# Patient Record
Sex: Male | Born: 1980 | Race: White | Hispanic: No | Marital: Married | Smoking: Never smoker
Health system: Southern US, Community
[De-identification: ages and names within clinical notes are randomized; demographics above are authoritative.]

## PROBLEM LIST (undated history)

## (undated) DIAGNOSIS — N289 Disorder of kidney and ureter, unspecified: Secondary | ICD-10-CM

## (undated) DIAGNOSIS — M109 Gout, unspecified: Secondary | ICD-10-CM

## (undated) DIAGNOSIS — E039 Hypothyroidism, unspecified: Secondary | ICD-10-CM

## (undated) HISTORY — PX: TONSILLECTOMY: SUR1361

## (undated) HISTORY — PX: HERNIA REPAIR: SHX51

---

## 2013-06-17 DIAGNOSIS — J309 Allergic rhinitis, unspecified: Secondary | ICD-10-CM | POA: Insufficient documentation

## 2013-06-30 DIAGNOSIS — K4090 Unilateral inguinal hernia, without obstruction or gangrene, not specified as recurrent: Secondary | ICD-10-CM | POA: Insufficient documentation

## 2014-01-21 DIAGNOSIS — R1032 Left lower quadrant pain: Secondary | ICD-10-CM | POA: Insufficient documentation

## 2014-05-31 DIAGNOSIS — G8194 Hemiplegia, unspecified affecting left nondominant side: Secondary | ICD-10-CM | POA: Insufficient documentation

## 2014-05-31 DIAGNOSIS — G930 Cerebral cysts: Secondary | ICD-10-CM | POA: Insufficient documentation

## 2014-06-01 DIAGNOSIS — F449 Dissociative and conversion disorder, unspecified: Secondary | ICD-10-CM | POA: Insufficient documentation

## 2014-09-12 DIAGNOSIS — E538 Deficiency of other specified B group vitamins: Secondary | ICD-10-CM | POA: Insufficient documentation

## 2015-12-04 DIAGNOSIS — E782 Mixed hyperlipidemia: Secondary | ICD-10-CM | POA: Insufficient documentation

## 2016-05-29 DIAGNOSIS — R7303 Prediabetes: Secondary | ICD-10-CM | POA: Insufficient documentation

## 2016-05-30 DIAGNOSIS — B001 Herpesviral vesicular dermatitis: Secondary | ICD-10-CM | POA: Insufficient documentation

## 2016-12-17 DIAGNOSIS — H9193 Unspecified hearing loss, bilateral: Secondary | ICD-10-CM | POA: Insufficient documentation

## 2018-02-27 DIAGNOSIS — B009 Herpesviral infection, unspecified: Secondary | ICD-10-CM | POA: Insufficient documentation

## 2018-05-03 ENCOUNTER — Other Ambulatory Visit: Payer: Self-pay

## 2018-05-03 ENCOUNTER — Emergency Department
Admission: EM | Admit: 2018-05-03 | Discharge: 2018-05-03 | Disposition: A | Discharge status: Home / Self Care | Payer: 59 | Attending: Emergency Medicine | Admitting: Emergency Medicine

## 2018-05-03 DIAGNOSIS — M109 Gout, unspecified: Secondary | ICD-10-CM

## 2018-05-03 DIAGNOSIS — E039 Hypothyroidism, unspecified: Secondary | ICD-10-CM | POA: Diagnosis not present

## 2018-05-03 DIAGNOSIS — M79671 Pain in right foot: Secondary | ICD-10-CM | POA: Diagnosis present

## 2018-05-03 LAB — CBC WITH DIFFERENTIAL/PLATELET
Basophils Absolute: 0 10*3/uL (ref 0–0.1)
Basophils Relative: 0 %
Eosinophils Absolute: 0 10*3/uL (ref 0–0.7)
Eosinophils Relative: 1 %
HCT: 42.8 % (ref 40.0–52.0)
HEMOGLOBIN: 14.5 g/dL (ref 13.0–18.0)
LYMPHS ABS: 1.7 10*3/uL (ref 1.0–3.6)
Lymphocytes Relative: 21 %
MCH: 30.5 pg (ref 26.0–34.0)
MCHC: 33.9 g/dL (ref 32.0–36.0)
MCV: 90 fL (ref 80.0–100.0)
Monocytes Absolute: 0.6 10*3/uL (ref 0.2–1.0)
Monocytes Relative: 8 %
NEUTROS ABS: 5.6 10*3/uL (ref 1.4–6.5)
NEUTROS PCT: 70 %
PLATELETS: 158 10*3/uL (ref 150–440)
RBC: 4.76 MIL/uL (ref 4.40–5.90)
RDW: 13.6 % (ref 11.5–14.5)
WBC: 8 10*3/uL (ref 3.8–10.6)

## 2018-05-03 LAB — BASIC METABOLIC PANEL
Anion gap: 8 (ref 5–15)
BUN: 14 mg/dL (ref 6–20)
CHLORIDE: 103 mmol/L (ref 101–111)
CO2: 27 mmol/L (ref 22–32)
Calcium: 9.1 mg/dL (ref 8.9–10.3)
Creatinine, Ser: 1.33 mg/dL — ABNORMAL HIGH (ref 0.61–1.24)
GFR calc Af Amer: >60 mL/min (ref >60)
GFR calc non Af Amer: >60 mL/min (ref >60)
Glucose, Bld: 94 mg/dL (ref 65–99)
Potassium: 3.7 mmol/L (ref 3.5–5.1)
SODIUM: 138 mmol/L (ref 135–145)

## 2018-05-03 LAB — URIC ACID: Uric Acid, Serum: 9.4 mg/dL — ABNORMAL HIGH (ref 4.4–7.6)

## 2018-05-03 MED ORDER — COLCHICINE 0.6 MG PO TABS: 1.2000 mg | ORAL_TABLET | Freq: Once | ORAL | 0 refills | Status: AC

## 2018-05-03 MED ORDER — COLCHICINE 0.6 MG PO TABS
1.2000 mg | ORAL_TABLET | Freq: Once | ORAL | Status: AC
  Administered 2018-05-03: 1.2 mg via ORAL
  Filled 2018-05-03: qty 2

## 2018-05-03 MED ORDER — HYDROCODONE-ACETAMINOPHEN 5-325 MG PO TABS: 1.0000 | ORAL_TABLET | ORAL | 0 refills | Status: AC | PRN

## 2018-05-03 MED ORDER — COLCHICINE 0.6 MG PO TABS
0.6000 mg | ORAL_TABLET | Freq: Once | ORAL | Status: AC
  Administered 2018-05-03: 0.6 mg via ORAL
  Filled 2018-05-03: qty 1

## 2018-05-03 NOTE — ED Provider Notes (Signed)
[facility] Emergency Department Provider Note ____________________________________________  Time seen: Approximately 3:21 PM  I have reviewed the triage vital signs and the nursing notes.   HISTORY  Chief Complaint Foot Pain    HPI Pedro Elliott is a 37 y.o. male who presents to the emergency department for evaluation and treatment of right ankle pain. He had similar symptoms in January while on a cruise and was evaluated by his PCP on his return, but symptoms had already resolved. Current pain started about 24 hours ago and has gradually progressed. Initially it was partially relieved by ibuprofen, but now is not. His last dose was at about 10 am. His only significant past medical history would be hypothyroidism.  He takes daily thyroid medication, but otherwise does not take any daily meds.  No past medical history on file.  There are no active problems to display for this patient.   Prior to Admission medications   Medication Sig Start Date End Date Taking? Authorizing Provider  colchicine 0.6 MG tablet Take 2 tablets (1.2 mg total) by mouth once for 1 dose. If pain persists after 1 hour, take 1 additional pill. No more than 3 pills in 24 hours. 05/03/18 05/03/18  Calloway Andrus, Rulon Eisenmenger B, FNP  HYDROcodone-acetaminophen (NORCO/VICODIN) 5-325 MG tablet Take 1 tablet by mouth every 4 (four) hours as needed for moderate pain. 05/03/18 05/03/19  Chinita Pester, FNP    Allergies Patient has no known allergies.  No family history on file.  Social History Social History   Tobacco Use  . Smoking status: Not on file  Substance Use Topics  . Alcohol use: Not on file  . Drug use: Not on file    Review of Systems Constitutional: Negative for fever. Cardiovascular: Negative for chest pain. Respiratory: Negative for shortness of breath. Musculoskeletal: Positive for right ankle pain. Skin: Positive for erythema and mild edema of the right ankle Neurological: Negative  for decrease in sensation  ____________________________________________   PHYSICAL EXAM:  VITAL SIGNS: ED Triage Vitals  Enc Vitals Group     BP 05/03/18 1324 (!) 125/95     Pulse Rate 05/03/18 1324 94     Resp 05/03/18 1324 18     Temp 05/03/18 1324 98.4 F (36.9 C)     Temp Source 05/03/18 1324 Oral     SpO2 05/03/18 1324 100 %     Weight 05/03/18 1325 235 lb (106.6 kg)     Height 05/03/18 1325  (1.88 m)     Head Circumference --      Peak Flow --      Pain Score 05/03/18 1324 9     Pain Loc --      Pain Edu? --      Excl. in [place]? --     Constitutional: Alert and oriented. Well appearing and in no acute distress. Eyes: Conjunctivae are clear without discharge or drainage Head: Atraumatic Neck: Supple Respiratory: No cough. Respirations are even and unlabored. Musculoskeletal: Full range of motion of the right foot and ankle is possible but painful. Neurologic: Sensation and motor function is intact throughout. Skin: Mild edema and erythema diffuse over the right ankle with increase in skin temperature. Psychiatric: Affect and behavior are appropriate.  ____________________________________________   LABS (all labs ordered are listed, but only abnormal results are displayed)  Labs Reviewed  URIC ACID - Abnormal; Notable for the following components:      Result Value   Uric Acid, Serum 9.4 (*)  All other components within normal limits  BASIC METABOLIC PANEL - Abnormal; Notable for the following components:   Creatinine, Ser 1.33 (*)    All other components within normal limits  CBC WITH DIFFERENTIAL/PLATELET   ____________________________________________  RADIOLOGY  Not indicated ____________________________________________   PROCEDURES  Procedures  ____________________________________________   INITIAL IMPRESSION / ASSESSMENT AND PLAN / ED COURSE  Pedro Elliott is a 37 y.o. who presents to the emergency department for treatment and evaluation  of sudden onset right ankle pain.  Patient had similar symptoms while on a cruise a few months ago and was suspected to have gout.  Uric acid, CBC, and BMP will be requested today.  He will be given 1.2 mg of colchicine and then reevaluated in about an hour.  ----------------------------------------- 4:36 PM on 05/03/2018 -----------------------------------------  Pain has started to improve after colchicine. Additional tablet has been ordered.  Awaiting lab studies.  Labs were discussed with the patient who will be discharged with colchicine and norco. He is to return to the ER for symptoms that change or worsen or for new concerns if unable to schedule an appointment.  Patient instructed to follow-up with his PCP.  He was also instructed to return to the emergency department for symptoms that change or worsen if unable schedule an appointment with orthopedics or primary care.  Medications  colchicine tablet 1.2 mg (1.2 mg Oral Given 05/03/18 1558)  colchicine tablet 0.6 mg (0.6 mg Oral Given 05/03/18 1703)    Pertinent labs & imaging results that were available during my care of the patient were reviewed by me and considered in my medical decision making (see chart for details).  _________________________________________   FINAL CLINICAL IMPRESSION(S) / ED DIAGNOSES  Final diagnoses:  Acute gout of right ankle, unspecified cause    ED Discharge Orders        Ordered    colchicine 0.6 MG tablet   Once     05/03/18 1708    HYDROcodone-acetaminophen (NORCO/VICODIN) 5-325 MG tablet  Every 4 hours PRN     05/03/18 1708       If controlled substance prescribed during this visit, 12 month history viewed on the [facility] prior to issuing an initial prescription for Schedule II or III opiod.    Chinita Pester, FNP 05/03/18 1955    Emily Filbert, MD 05/03/18 2059

## 2018-05-03 NOTE — ED Triage Notes (Signed)
Pt reports that while on a cruise in January he started having rt foot pain and was told on the cruise it may be gout. Pt states that it started bothering him again yesterday and the swelling in his foot is worse and radiating into his ankle, pt is limping with his gait, states that he took ibuprofen last night with relief, no relief this am when he took it. Pedal pulse palpated

## 2018-05-18 DIAGNOSIS — J0100 Acute maxillary sinusitis, unspecified: Secondary | ICD-10-CM | POA: Insufficient documentation

## 2018-10-14 ENCOUNTER — Other Ambulatory Visit: Payer: Self-pay | Admitting: Gastroenterology

## 2018-10-14 DIAGNOSIS — K7689 Other specified diseases of liver: Secondary | ICD-10-CM

## 2018-10-21 ENCOUNTER — Ambulatory Visit: Payer: 59

## 2018-10-29 ENCOUNTER — Ambulatory Visit
Admission: RE | Admit: 2018-10-29 | Discharge: 2018-10-29 | Disposition: A | Discharge status: Home / Self Care | Payer: 59 | Source: Ambulatory Visit | Attending: Gastroenterology | Admitting: Gastroenterology

## 2018-10-29 DIAGNOSIS — K7689 Other specified diseases of liver: Secondary | ICD-10-CM | POA: Diagnosis present

## 2018-10-29 DIAGNOSIS — N281 Cyst of kidney, acquired: Secondary | ICD-10-CM | POA: Diagnosis not present

## 2018-11-04 ENCOUNTER — Other Ambulatory Visit: Payer: Self-pay | Admitting: Gastroenterology

## 2018-11-04 DIAGNOSIS — K824 Cholesterolosis of gallbladder: Secondary | ICD-10-CM

## 2018-11-12 DIAGNOSIS — R200 Anesthesia of skin: Secondary | ICD-10-CM | POA: Insufficient documentation

## 2019-02-03 ENCOUNTER — Ambulatory Visit
Admission: RE | Admit: 2019-02-03 | Discharge: 2019-02-03 | Disposition: A | Discharge status: Home / Self Care | Payer: BLUE CROSS/BLUE SHIELD | Source: Ambulatory Visit | Attending: Gastroenterology | Admitting: Gastroenterology

## 2019-02-03 ENCOUNTER — Encounter (INDEPENDENT_AMBULATORY_CARE_PROVIDER_SITE_OTHER): Payer: Self-pay

## 2019-02-03 DIAGNOSIS — K824 Cholesterolosis of gallbladder: Secondary | ICD-10-CM | POA: Insufficient documentation

## 2019-02-18 ENCOUNTER — Other Ambulatory Visit
Admission: RE | Admit: 2019-02-18 | Discharge: 2019-02-18 | Disposition: A | Discharge status: Home / Self Care | Payer: BLUE CROSS/BLUE SHIELD | Attending: Gastroenterology | Admitting: Gastroenterology

## 2019-02-18 DIAGNOSIS — R197 Diarrhea, unspecified: Secondary | ICD-10-CM | POA: Insufficient documentation

## 2019-02-18 DIAGNOSIS — R194 Change in bowel habit: Secondary | ICD-10-CM | POA: Diagnosis not present

## 2019-02-18 DIAGNOSIS — R1010 Upper abdominal pain, unspecified: Secondary | ICD-10-CM | POA: Diagnosis not present

## 2019-02-18 LAB — C DIFFICILE QUICK SCREEN W PCR REFLEX
C DIFFICILE (CDIFF) INTERP: NOT DETECTED
C DIFFICILE (CDIFF) TOXIN: NEGATIVE
C DIFFICLE (CDIFF) ANTIGEN: NEGATIVE

## 2019-02-19 LAB — GASTROINTESTINAL PANEL BY PCR, STOOL (REPLACES STOOL CULTURE)

## 2019-02-19 LAB — CALPROTECTIN, FECAL: Calprotectin, Fecal: <16 ug/g (ref 0–120)

## 2019-04-13 ENCOUNTER — Other Ambulatory Visit: Payer: Self-pay | Admitting: Gastroenterology

## 2019-04-13 DIAGNOSIS — R112 Nausea with vomiting, unspecified: Secondary | ICD-10-CM

## 2019-06-23 ENCOUNTER — Encounter
Admission: RE | Admit: 2019-06-23 | Discharge: 2019-06-23 | Disposition: A | Discharge status: Home / Self Care | Payer: BC Managed Care – PPO | Source: Ambulatory Visit | Attending: Gastroenterology | Admitting: Gastroenterology

## 2019-06-23 ENCOUNTER — Other Ambulatory Visit: Payer: Self-pay

## 2019-06-23 DIAGNOSIS — R112 Nausea with vomiting, unspecified: Secondary | ICD-10-CM | POA: Diagnosis not present

## 2019-06-23 MED ORDER — TECHNETIUM TC 99M MEBROFENIN IV KIT
4.9460 | PACK | Freq: Once | INTRAVENOUS | Status: AC | PRN
  Administered 2019-06-23: 4.946 via INTRAVENOUS

## 2019-09-15 DIAGNOSIS — Z79899 Other long term (current) drug therapy: Secondary | ICD-10-CM | POA: Insufficient documentation

## 2019-09-15 DIAGNOSIS — M1A00X0 Idiopathic chronic gout, unspecified site, without tophus (tophi): Secondary | ICD-10-CM | POA: Insufficient documentation

## 2020-01-25 DIAGNOSIS — G4733 Obstructive sleep apnea (adult) (pediatric): Secondary | ICD-10-CM | POA: Insufficient documentation

## 2020-05-17 ENCOUNTER — Other Ambulatory Visit: Payer: Self-pay

## 2020-05-17 ENCOUNTER — Encounter: Payer: Self-pay | Admitting: Emergency Medicine

## 2020-05-17 ENCOUNTER — Emergency Department
Admission: EM | Admit: 2020-05-17 | Discharge: 2020-05-17 | Disposition: A | Discharge status: Home / Self Care | Payer: Medicaid Other | Attending: Emergency Medicine | Admitting: Emergency Medicine

## 2020-05-17 ENCOUNTER — Emergency Department: Payer: Medicaid Other

## 2020-05-17 DIAGNOSIS — N201 Calculus of ureter: Secondary | ICD-10-CM | POA: Diagnosis not present

## 2020-05-17 DIAGNOSIS — R1032 Left lower quadrant pain: Secondary | ICD-10-CM | POA: Diagnosis present

## 2020-05-17 DIAGNOSIS — E039 Hypothyroidism, unspecified: Secondary | ICD-10-CM | POA: Diagnosis not present

## 2020-05-17 DIAGNOSIS — R110 Nausea: Secondary | ICD-10-CM | POA: Diagnosis not present

## 2020-05-17 HISTORY — DX: Gout, unspecified: M10.9

## 2020-05-17 HISTORY — DX: Hypothyroidism, unspecified: E03.9

## 2020-05-17 LAB — CBC WITH DIFFERENTIAL/PLATELET
Abs Immature Granulocytes: 0.02 10*3/uL (ref 0.00–0.07)
Basophils Absolute: 0.1 10*3/uL (ref 0.0–0.1)
Basophils Relative: 1 %
Eosinophils Absolute: 0.1 10*3/uL (ref 0.0–0.5)
Eosinophils Relative: 1 %
HCT: 41.3 % (ref 39.0–52.0)
Hemoglobin: 14.2 g/dL (ref 13.0–17.0)
Immature Granulocytes: 0 %
Lymphocytes Relative: 44 %
Lymphs Abs: 3 10*3/uL (ref 0.7–4.0)
MCH: 30.7 pg (ref 26.0–34.0)
MCHC: 34.4 g/dL (ref 30.0–36.0)
MCV: 89.2 fL (ref 80.0–100.0)
Monocytes Absolute: 0.6 10*3/uL (ref 0.1–1.0)
Monocytes Relative: 8 %
Neutro Abs: 3.2 10*3/uL (ref 1.7–7.7)
Neutrophils Relative %: 46 %
Platelets: 199 10*3/uL (ref 150–400)
RBC: 4.63 MIL/uL (ref 4.22–5.81)
RDW: 12.7 % (ref 11.5–15.5)
WBC: 6.9 10*3/uL (ref 4.0–10.5)
nRBC: 0 % (ref 0.0–0.2)

## 2020-05-17 LAB — COMPREHENSIVE METABOLIC PANEL
ALT: 32 U/L (ref 0–44)
AST: 25 U/L (ref 15–41)
Albumin: 4.6 g/dL (ref 3.5–5.0)
Alkaline Phosphatase: 47 U/L (ref 38–126)
Anion gap: 8 (ref 5–15)
BUN: 13 mg/dL (ref 6–20)
CO2: 26 mmol/L (ref 22–32)
Calcium: 9.5 mg/dL (ref 8.9–10.3)
Chloride: 106 mmol/L (ref 98–111)
Creatinine, Ser: 1.35 mg/dL — ABNORMAL HIGH (ref 0.61–1.24)
GFR calc Af Amer: >60 mL/min (ref >60)
GFR calc non Af Amer: >60 mL/min (ref >60)
Glucose, Bld: 120 mg/dL — ABNORMAL HIGH (ref 70–99)
Potassium: 3.6 mmol/L (ref 3.5–5.1)
Sodium: 140 mmol/L (ref 135–145)
Total Bilirubin: 0.8 mg/dL (ref 0.3–1.2)
Total Protein: 7.5 g/dL (ref 6.5–8.1)

## 2020-05-17 LAB — URINALYSIS, COMPLETE (UACMP) WITH MICROSCOPIC
Bacteria, UA: NONE SEEN
Bilirubin Urine: NEGATIVE
Glucose, UA: NEGATIVE mg/dL
Ketones, ur: NEGATIVE mg/dL
Leukocytes,Ua: NEGATIVE
Nitrite: NEGATIVE
Protein, ur: NEGATIVE mg/dL
RBC / HPF: >50 RBC/hpf — ABNORMAL HIGH (ref 0–5)
Specific Gravity, Urine: 1.019 (ref 1.005–1.030)
Squamous Epithelial / LPF: NONE SEEN (ref 0–5)
pH: 7 (ref 5.0–8.0)

## 2020-05-17 MED ORDER — LACTATED RINGERS IV BOLUS: 1000.0000 mL | Freq: Once | INTRAVENOUS | Status: DC

## 2020-05-17 MED ORDER — OXYCODONE-ACETAMINOPHEN 5-325 MG PO TABS
1.0000 | ORAL_TABLET | Freq: Once | ORAL | Status: AC
  Administered 2020-05-17: 1 via ORAL
  Filled 2020-05-17: qty 1

## 2020-05-17 MED ORDER — MORPHINE SULFATE (PF) 4 MG/ML IV SOLN
4.0000 mg | Freq: Once | INTRAVENOUS | Status: AC
  Administered 2020-05-17: 4 mg via INTRAVENOUS
  Filled 2020-05-17: qty 1

## 2020-05-17 MED ORDER — KETOROLAC TROMETHAMINE 30 MG/ML IJ SOLN
15.0000 mg | Freq: Once | INTRAMUSCULAR | Status: AC
  Administered 2020-05-17: 15 mg via INTRAVENOUS
  Filled 2020-05-17: qty 1

## 2020-05-17 MED ORDER — ONDANSETRON 4 MG PO TBDP: 4.0000 mg | ORAL_TABLET | Freq: Three times a day (TID) | ORAL | 0 refills | Status: DC | PRN

## 2020-05-17 MED ORDER — TAMSULOSIN HCL 0.4 MG PO CAPS: 0.4000 mg | ORAL_CAPSULE | Freq: Every day | ORAL | 0 refills | Status: DC

## 2020-05-17 MED ORDER — KETOROLAC TROMETHAMINE 30 MG/ML IJ SOLN
INTRAMUSCULAR | Status: AC
  Filled 2020-05-17: qty 1

## 2020-05-17 MED ORDER — OXYCODONE-ACETAMINOPHEN 5-325 MG PO TABS: 1.0000 | ORAL_TABLET | ORAL | 0 refills | Status: DC | PRN

## 2020-05-17 MED ORDER — ONDANSETRON HCL 4 MG/2ML IJ SOLN
4.0000 mg | Freq: Once | INTRAMUSCULAR | Status: AC
  Administered 2020-05-17: 4 mg via INTRAVENOUS
  Filled 2020-05-17: qty 2

## 2020-05-17 NOTE — ED Provider Notes (Signed)
[facility] Emergency Department Provider Note   ____________________________________________   First MD Initiated Contact with Patient 05/17/20 2014     (approximate)  I have reviewed the triage vital signs and the nursing notes.   HISTORY  Chief Complaint Flank Pain    HPI Pedro Elliott is a 39 y.o. male with past medical history of hypothyroidism and gout who presents to the ED complaining of flank pain.  Patient reports he first developed pain in the left side of his groin earlier this afternoon, which he thought could be related to an old inguinal hernia.  Since then, pain has spread up into the left lower quadrant of his abdomen and his left flank.  He describes it as sharp and severe, not exacerbated or alleviated by anything.  He has been feeling nauseous but has not vomited.  He denies any fevers, chills, dysuria, or hematuria.  He denies any history of kidney stones.        Past Medical History:  Diagnosis Date  . Gout   . Hypothyroidism     There are no problems to display for this patient.   Past Surgical History:  Procedure Laterality Date  . HERNIA REPAIR    . TONSILLECTOMY      Prior to Admission medications   Medication Sig Start Date End Date Taking? Authorizing Provider  colchicine 0.6 MG tablet Take 2 tablets (1.2 mg total) by mouth once for 1 dose. If pain persists after 1 hour, take 1 additional pill. No more than 3 pills in 24 hours. 05/03/18 05/03/18  Triplett, Johnette Abraham B, FNP  ondansetron (ZOFRAN ODT) 4 MG disintegrating tablet Take 1 tablet (4 mg total) by mouth every 8 (eight) hours as needed for nausea or vomiting. 05/17/20   Blake Divine, MD  oxyCODONE-acetaminophen (PERCOCET) 5-325 MG tablet Take 1 tablet by mouth every 4 (four) hours as needed for severe pain. 05/17/20 05/17/21  Blake Divine, MD  tamsulosin (FLOMAX) 0.4 MG CAPS capsule Take 1 capsule (0.4 mg total) by mouth daily after breakfast. 05/17/20   Blake Divine,  MD    Allergies Patient has no known allergies.  No family history on file.  Social History Social History   Tobacco Use  . Smoking status: Never Smoker  . Smokeless tobacco: Never Used  Substance Use Topics  . Alcohol use: Not on file  . Drug use: Not on file    Review of Systems  Constitutional: No fever/chills Eyes: No visual changes. ENT: No sore throat. Cardiovascular: Denies chest pain. Respiratory: Denies shortness of breath. Gastrointestinal: Positive for flank and abdominal pain.  No nausea, no vomiting.  No diarrhea.  No constipation. Genitourinary: Negative for dysuria. Musculoskeletal: Negative for back pain. Skin: Negative for rash. Neurological: Negative for headaches, focal weakness or numbness.  ____________________________________________   PHYSICAL EXAM:  VITAL SIGNS: ED Triage Vitals [05/17/20 1923]  Enc Vitals Group     BP (!) 150/82     Pulse Rate 76     Resp 18     Temp 97.6 F (36.4 C)     Temp Source Oral     SpO2 100 %     Weight 235 lb (106.6 kg)     Height 6\' 1"  (1.854 m)     Head Circumference      Peak Flow      Pain Score 10     Pain Loc      Pain Edu?      Excl. in [place]?  Constitutional: Alert and oriented. Eyes: Conjunctivae are normal. Head: Atraumatic. Nose: No congestion/rhinnorhea. Mouth/Throat: Mucous membranes are moist. Neck: Normal ROM Cardiovascular: Normal rate, regular rhythm. Grossly normal heart sounds. Respiratory: Normal respiratory effort.  No retractions. Lungs CTAB. Gastrointestinal: Soft and nontender.  No CVA tenderness bilaterally.  No distention. Genitourinary: deferred Musculoskeletal: No lower extremity tenderness nor edema. Neurologic:  Normal speech and language. No gross focal neurologic deficits are appreciated. Skin:  Skin is warm, dry and intact. No rash noted. Psychiatric: Mood and affect are normal. Speech and behavior are normal.  ____________________________________________    LABS (all labs ordered are listed, but only abnormal results are displayed)  Labs Reviewed  COMPREHENSIVE METABOLIC PANEL - Abnormal; Notable for the following components:      Result Value   Glucose, Bld 120 (*)    Creatinine, Ser 1.35 (*)    All other components within normal limits  URINALYSIS, COMPLETE (UACMP) WITH MICROSCOPIC - Abnormal; Notable for the following components:   Color, Urine YELLOW (*)    APPearance CLOUDY (*)    Hgb urine dipstick LARGE (*)    RBC / HPF >50 (*)    All other components within normal limits  CBC WITH DIFFERENTIAL/PLATELET    PROCEDURES  Procedure(s) performed (including Critical Care):  Procedures   ____________________________________________   INITIAL IMPRESSION / ASSESSMENT AND PLAN / ED COURSE       39 year old male presents to the ED complaining of acute onset left groin pain radiating up into his left flank.  He appears very uncomfortable and in severe pain on evaluation.  CT scan is consistent with nephrolithiasis with 5 mm distal left ureteral stone.  Lab work is unremarkable and UA shows no evidence of infection.  Patient's pain is now much improved following dose of Toradol and he is appropriate for discharge home.  We will provide referral to urology and prescribe Percocet, Flomax, and Zofran.  Patient was counseled to return to the ED for new or worsening symptoms, patient agrees with plan.      ____________________________________________   FINAL CLINICAL IMPRESSION(S) / ED DIAGNOSES  Final diagnoses:  Ureterolithiasis     ED Discharge Orders         Ordered    tamsulosin (FLOMAX) 0.4 MG CAPS capsule  Daily after breakfast     05/17/20 2154    oxyCODONE-acetaminophen (PERCOCET) 5-325 MG tablet  Every 4 hours PRN     05/17/20 2154    ondansetron (ZOFRAN ODT) 4 MG disintegrating tablet  Every 8 hours PRN     05/17/20 2154           Note:  This document was prepared using Dragon voice recognition software and  may include unintentional dictation errors.   Chesley Noon, MD 05/17/20 2228

## 2020-05-17 NOTE — ED Triage Notes (Signed)
Pt to triage via w/c, mask in place, appears uncomfortable; c/o left flank/groin that began PTA with no accomp symptoms; denies hx of same

## 2020-05-19 ENCOUNTER — Observation Stay: Payer: Medicaid Other

## 2020-05-19 ENCOUNTER — Other Ambulatory Visit: Payer: Self-pay

## 2020-05-19 ENCOUNTER — Observation Stay
Admission: EM | Admit: 2020-05-19 | Discharge: 2020-05-20 | Disposition: A | Discharge status: Home / Self Care | Payer: Medicaid Other | Attending: Internal Medicine | Admitting: Internal Medicine

## 2020-05-19 DIAGNOSIS — N132 Hydronephrosis with renal and ureteral calculous obstruction: Principal | ICD-10-CM | POA: Insufficient documentation

## 2020-05-19 DIAGNOSIS — Z79899 Other long term (current) drug therapy: Secondary | ICD-10-CM | POA: Insufficient documentation

## 2020-05-19 DIAGNOSIS — Z79890 Hormone replacement therapy (postmenopausal): Secondary | ICD-10-CM | POA: Diagnosis not present

## 2020-05-19 DIAGNOSIS — N182 Chronic kidney disease, stage 2 (mild): Secondary | ICD-10-CM | POA: Diagnosis not present

## 2020-05-19 DIAGNOSIS — M109 Gout, unspecified: Secondary | ICD-10-CM | POA: Diagnosis not present

## 2020-05-19 DIAGNOSIS — N1330 Unspecified hydronephrosis: Secondary | ICD-10-CM | POA: Diagnosis not present

## 2020-05-19 DIAGNOSIS — E876 Hypokalemia: Secondary | ICD-10-CM | POA: Diagnosis present

## 2020-05-19 DIAGNOSIS — E039 Hypothyroidism, unspecified: Secondary | ICD-10-CM | POA: Diagnosis present

## 2020-05-19 DIAGNOSIS — N179 Acute kidney failure, unspecified: Secondary | ICD-10-CM | POA: Insufficient documentation

## 2020-05-19 DIAGNOSIS — Z20822 Contact with and (suspected) exposure to covid-19: Secondary | ICD-10-CM | POA: Diagnosis not present

## 2020-05-19 DIAGNOSIS — N200 Calculus of kidney: Secondary | ICD-10-CM

## 2020-05-19 DIAGNOSIS — N201 Calculus of ureter: Secondary | ICD-10-CM | POA: Diagnosis not present

## 2020-05-19 LAB — BASIC METABOLIC PANEL
Anion gap: 9 (ref 5–15)
BUN: 10 mg/dL (ref 6–20)
CO2: 23 mmol/L (ref 22–32)
Calcium: 9.2 mg/dL (ref 8.9–10.3)
Chloride: 106 mmol/L (ref 98–111)
Creatinine, Ser: 1.39 mg/dL — ABNORMAL HIGH (ref 0.61–1.24)
GFR calc Af Amer: >60 mL/min (ref >60)
GFR calc non Af Amer: >60 mL/min (ref >60)
Glucose, Bld: 119 mg/dL — ABNORMAL HIGH (ref 70–99)
Potassium: 3.4 mmol/L — ABNORMAL LOW (ref 3.5–5.1)
Sodium: 138 mmol/L (ref 135–145)

## 2020-05-19 LAB — URINALYSIS, COMPLETE (UACMP) WITH MICROSCOPIC
Bacteria, UA: NONE SEEN
Bilirubin Urine: NEGATIVE
Glucose, UA: NEGATIVE mg/dL
Ketones, ur: NEGATIVE mg/dL
Leukocytes,Ua: NEGATIVE
Nitrite: NEGATIVE
Protein, ur: NEGATIVE mg/dL
Specific Gravity, Urine: 1.006 (ref 1.005–1.030)
Squamous Epithelial / HPF: NONE SEEN (ref 0–5)
pH: 5 (ref 5.0–8.0)

## 2020-05-19 LAB — CBC
HCT: 42.7 % (ref 39.0–52.0)
Hemoglobin: 14.3 g/dL (ref 13.0–17.0)
MCH: 30.4 pg (ref 26.0–34.0)
MCHC: 33.5 g/dL (ref 30.0–36.0)
MCV: 90.9 fL (ref 80.0–100.0)
Platelets: 188 10*3/uL (ref 150–400)
RBC: 4.7 MIL/uL (ref 4.22–5.81)
RDW: 12.4 % (ref 11.5–15.5)
WBC: 11.9 10*3/uL — ABNORMAL HIGH (ref 4.0–10.5)
nRBC: 0 % (ref 0.0–0.2)

## 2020-05-19 LAB — PROTIME-INR
INR: 1 (ref 0.8–1.2)
Prothrombin Time: 12.6 seconds (ref 11.4–15.2)

## 2020-05-19 LAB — APTT: aPTT: 25 seconds (ref 24–36)

## 2020-05-19 LAB — SARS CORONAVIRUS 2 BY RT PCR (HOSPITAL ORDER, PERFORMED IN ~~LOC~~ HOSPITAL LAB): SARS Coronavirus 2: NEGATIVE

## 2020-05-19 MED ORDER — HYDROMORPHONE HCL 1 MG/ML IJ SOLN
1.0000 mg | INTRAMUSCULAR | Status: DC | PRN
  Administered 2020-05-19 – 2020-05-20 (×3): 1 mg via INTRAVENOUS
  Filled 2020-05-19 (×3): qty 1

## 2020-05-19 MED ORDER — HYDROMORPHONE HCL 1 MG/ML IJ SOLN
1.0000 mg | INTRAMUSCULAR | Status: DC | PRN
  Administered 2020-05-19: 1 mg via INTRAVENOUS
  Filled 2020-05-19: qty 1

## 2020-05-19 MED ORDER — POTASSIUM CHLORIDE CRYS ER 20 MEQ PO TBCR
20.0000 meq | EXTENDED_RELEASE_TABLET | Freq: Once | ORAL | Status: AC
  Administered 2020-05-19: 20 meq via ORAL
  Filled 2020-05-19: qty 1

## 2020-05-19 MED ORDER — ONDANSETRON HCL 4 MG/2ML IJ SOLN: 4.0000 mg | Freq: Once | INTRAMUSCULAR | Status: AC

## 2020-05-19 MED ORDER — ONDANSETRON HCL 4 MG/2ML IJ SOLN: 4.0000 mg | Freq: Once | INTRAMUSCULAR | Status: DC

## 2020-05-19 MED ORDER — ACETAMINOPHEN 325 MG PO TABS: 650.0000 mg | ORAL_TABLET | Freq: Four times a day (QID) | ORAL | Status: DC | PRN

## 2020-05-19 MED ORDER — ONDANSETRON HCL 4 MG/2ML IJ SOLN
INTRAMUSCULAR | Status: AC
  Filled 2020-05-19: qty 2

## 2020-05-19 MED ORDER — HYDROMORPHONE HCL 1 MG/ML IJ SOLN
INTRAMUSCULAR | Status: AC
  Filled 2020-05-19: qty 1

## 2020-05-19 MED ORDER — HYDROMORPHONE HCL 1 MG/ML IJ SOLN
INTRAMUSCULAR | Status: AC
  Administered 2020-05-19: 1 mg via INTRAVENOUS
  Filled 2020-05-19: qty 1

## 2020-05-19 MED ORDER — OXYCODONE HCL 5 MG PO TABS
5.0000 mg | ORAL_TABLET | ORAL | Status: DC | PRN
  Administered 2020-05-20: 06:00:00 5 mg via ORAL
  Filled 2020-05-19: qty 1

## 2020-05-19 MED ORDER — ONDANSETRON HCL 4 MG/2ML IJ SOLN
4.0000 mg | Freq: Once | INTRAMUSCULAR | Status: AC
  Administered 2020-05-19: 4 mg via INTRAVENOUS

## 2020-05-19 MED ORDER — ONDANSETRON HCL 4 MG/2ML IJ SOLN
INTRAMUSCULAR | Status: AC
  Administered 2020-05-19: 4 mg via INTRAVENOUS
  Filled 2020-05-19: qty 2

## 2020-05-19 MED ORDER — MORPHINE SULFATE (PF) 4 MG/ML IV SOLN: 4.0000 mg | Freq: Once | INTRAVENOUS | Status: DC

## 2020-05-19 MED ORDER — TAMSULOSIN HCL 0.4 MG PO CAPS
0.4000 mg | ORAL_CAPSULE | Freq: Every day | ORAL | Status: DC
  Administered 2020-05-19 – 2020-05-20 (×2): 0.4 mg via ORAL
  Filled 2020-05-19 (×2): qty 1

## 2020-05-19 MED ORDER — KETOROLAC TROMETHAMINE 30 MG/ML IJ SOLN
30.0000 mg | Freq: Once | INTRAMUSCULAR | Status: AC
  Administered 2020-05-19: 30 mg via INTRAVENOUS

## 2020-05-19 MED ORDER — SODIUM CHLORIDE 0.9 % IV SOLN: INTRAVENOUS | Status: DC

## 2020-05-19 MED ORDER — ONDANSETRON HCL 4 MG/2ML IJ SOLN
4.0000 mg | Freq: Three times a day (TID) | INTRAMUSCULAR | Status: DC | PRN
  Administered 2020-05-19: 4 mg via INTRAVENOUS
  Filled 2020-05-19 (×2): qty 2

## 2020-05-19 MED ORDER — HYDROMORPHONE HCL 1 MG/ML IJ SOLN: 1.0000 mg | INTRAMUSCULAR | Status: AC

## 2020-05-19 MED ORDER — OXYCODONE-ACETAMINOPHEN 5-325 MG PO TABS: 1.0000 | ORAL_TABLET | ORAL | Status: DC | PRN

## 2020-05-19 MED ORDER — HYDROMORPHONE HCL 1 MG/ML IJ SOLN
1.0000 mg | Freq: Once | INTRAMUSCULAR | Status: AC
  Administered 2020-05-19: 1 mg via INTRAVENOUS

## 2020-05-19 NOTE — H&P (Signed)
History and Physical    Rehaan Viloria CHE:[id number] DOB: [birth year] DOA: 05/19/2020  Referring MD/NP/PA:   PCP: Zeb Comfort, MD   Patient coming from:  The patient is coming from home.  At baseline, pt is independent for most of ADL.        Chief Complaint: left flank pain  HPI: Wilmon Conover is a 39 y.o. male with medical history significant of hypothyroidism, gout, CKD-2, who presents with left flank pain.  Patient states that his left flank pain started at 5/19, which is constant, sharp, severe, radiating to the groin area.  Patient has mild burning on urination, and possible hematuria.  No nausea, vomiting, diarrhea or abdominal pain.  Denies chest pain, cough, shortness breath.  Patient was seen on Wednesday 5/19 in ED and had CT scan which showed mild left hydronephrosis and hydroureter, secondary to a 5 mm distal left ureteral stone at the mid sacral level. Pt went home with taking Flomax, but he continues to have worsening pain, therefore coming back to hospital for further evaluation and treatment.  ED Course: pt was found to have negative urinalysis, WBC 11.9, pending COVID-19 PCR, renal function close to baseline, potassium 3.4, temperature normal, blood pressure 134/79, heart rate 74, oxygen saturation 100% on room air.  Patient is placed on MedSurg bed for observation.  Urologist, Dr. Bernardo Heater is consulted.  Review of Systems:   General: no fevers, chills, no body weight gain, has fatigue HEENT: no blurry vision, hearing changes or sore throat Respiratory: no dyspnea, coughing, wheezing CV: no chest pain, no palpitations GI: no nausea, vomiting, abdominal pain, diarrhea, constipation GU: no dysuria, burning on urination, increased urinary frequency, has hematuria and left flank pain Ext: no leg edema Neuro: no unilateral weakness, numbness, or tingling, no vision change or hearing loss Skin: no rash, no skin tear. MSK: No muscle spasm, no deformity, no limitation of range  of movement in spin Heme: No easy bruising.  Travel history: No recent long distant travel.  Allergy:  Allergies  Allergen Reactions  . Other Anaphylaxis    cats    Past Medical History:  Diagnosis Date  . Gout   . Hypothyroidism     Past Surgical History:  Procedure Laterality Date  . HERNIA REPAIR    . TONSILLECTOMY      Social History:  reports that he has never smoked. He has never used smokeless tobacco. No history on file for alcohol and drug.  Family History:  Family History  Problem Relation Age of Onset  . Kidney Stones Mother   . Diabetes Mellitus II Mother   . Kidney Stones Father      Prior to Admission medications   Medication Sig Start Date End Date Taking? Authorizing Provider  colchicine 0.6 MG tablet Take 2 tablets (1.2 mg total) by mouth once for 1 dose. If pain persists after 1 hour, take 1 additional pill. No more than 3 pills in 24 hours. 05/03/18 05/03/18  Triplett, Johnette Abraham B, FNP  ondansetron (ZOFRAN ODT) 4 MG disintegrating tablet Take 1 tablet (4 mg total) by mouth every 8 (eight) hours as needed for nausea or vomiting. 05/17/20   Blake Divine, MD  oxyCODONE-acetaminophen (PERCOCET) 5-325 MG tablet Take 1 tablet by mouth every 4 (four) hours as needed for severe pain. 05/17/20 05/17/21  Blake Divine, MD  tamsulosin (FLOMAX) 0.4 MG CAPS capsule Take 1 capsule (0.4 mg total) by mouth daily after breakfast. 05/17/20   Blake Divine, MD    Physical  Exam: Vitals:   05/19/20 0927 05/19/20 0928 05/19/20 1445 05/19/20 1651  BP: 134/79  116/80 123/76  Pulse: 74  69 62  Resp: 18  18 18   Temp: 98 F (36.7 C)   97.7 F (36.5 C)  TempSrc: Oral   Oral  SpO2: 100%  98% 98%  Weight:  106 kg    Height:  6\' 1"  (1.854 m)     General: Not in acute distress HEENT:       Eyes: PERRL, EOMI, no scleral icterus.       ENT: No discharge from the ears and nose, no pharynx injection, no tonsillar enlargement.        Neck: No JVD, no bruit, no mass felt. Heme: No  neck lymph node enlargement. Cardiac: S1/S2, RRR, No murmurs, No gallops or rubs. Respiratory: No rales, wheezing, rhonchi or rubs. GI: Soft, nondistended, nontender, no rebound pain, no organomegaly, BS present. GU: has hematuria and left CVA tenderness Ext: No pitting leg edema bilaterally. 2+DP/PT pulse bilaterally. Musculoskeletal: No joint deformities, No joint redness or warmth, no limitation of ROM in spin. Skin: No rashes.  Neuro: Alert, oriented X3, cranial nerves II-XII grossly intact, moves all extremities normally. Psych: Patient is not psychotic, no suicidal or hemocidal ideation.  Labs on Admission: I have personally reviewed following labs and imaging studies  CBC: Recent Labs  Lab 05/17/20 1950 05/19/20 0940  WBC 6.9 11.9*  NEUTROABS 3.2  --   HGB 14.2 14.3  HCT 41.3 42.7  MCV 89.2 90.9  PLT 199 188   Basic Metabolic Panel: Recent Labs  Lab 05/17/20 1950 05/19/20 0940  NA 140 138  K 3.6 3.4*  CL 106 106  CO2 26 23  GLUCOSE 120* 119*  BUN 13 10  CREATININE 1.35* 1.39*  CALCIUM 9.5 9.2   GFR: Estimated Creatinine Clearance: 92.9 mL/min (A) (by C-G formula based on SCr of 1.39 mg/dL (H)). Liver Function Tests: Recent Labs  Lab 05/17/20 1950  AST 25  ALT 32  ALKPHOS 47  BILITOT 0.8  PROT 7.5  ALBUMIN 4.6   No results for input(s): LIPASE, AMYLASE in the last 168 hours. No results for input(s): AMMONIA in the last 168 hours. Coagulation Profile: Recent Labs  Lab 05/19/20 1439  INR 1.0   Cardiac Enzymes: No results for input(s): CKTOTAL, CKMB, CKMBINDEX, TROPONINI in the last 168 hours. BNP (last 3 results) No results for input(s): PROBNP in the last 8760 hours. HbA1C: No results for input(s): HGBA1C in the last 72 hours. CBG: No results for input(s): GLUCAP in the last 168 hours. Lipid Profile: No results for input(s): CHOL, HDL, LDLCALC, TRIG, CHOLHDL, LDLDIRECT in the last 72 hours. Thyroid Function Tests: No results for input(s):  TSH, T4TOTAL, FREET4, T3FREE, THYROIDAB in the last 72 hours. Anemia Panel: No results for input(s): VITAMINB12, FOLATE, FERRITIN, TIBC, IRON, RETICCTPCT in the last 72 hours. Urine analysis:    Component Value Date/Time   COLORURINE STRAW (A) 05/19/2020 1033   APPEARANCEUR CLEAR (A) 05/19/2020 1033   LABSPEC 1.006 05/19/2020 1033   PHURINE 5.0 05/19/2020 1033   GLUCOSEU NEGATIVE 05/19/2020 1033   HGBUR SMALL (A) 05/19/2020 1033   BILIRUBINUR NEGATIVE 05/19/2020 1033   KETONESUR NEGATIVE 05/19/2020 1033   PROTEINUR NEGATIVE 05/19/2020 1033   NITRITE NEGATIVE 05/19/2020 1033   LEUKOCYTESUR NEGATIVE 05/19/2020 1033   Sepsis Labs: @LABRCNTIP (procalcitonin:4,lacticidven:4) ) Recent Results (from the past 240 hour(s))  SARS Coronavirus 2 by RT PCR (hospital order, performed in [facility] hospital  lab) Nasopharyngeal Nasopharyngeal Swab     Status: None   Collection Time: 05/19/20 12:54 PM   Specimen: Nasopharyngeal Swab  Result Value Ref Range Status   SARS Coronavirus 2 NEGATIVE NEGATIVE Final    Comment: (NOTE) SARS-CoV-2 target nucleic acids are NOT DETECTED. The SARS-CoV-2 RNA is generally detectable in upper and lower respiratory specimens during the acute phase of infection. The lowest concentration of SARS-CoV-2 viral copies this assay can detect is 250 copies / mL. A negative result does not preclude SARS-CoV-2 infection and should not be used as the sole basis for treatment or other patient management decisions.  A negative result may occur with improper specimen collection / handling, submission of specimen other than nasopharyngeal swab, presence of viral mutation(s) within the areas targeted by this assay, and inadequate number of viral copies (<250 copies / mL). A negative result must be combined with clinical observations, patient history, and epidemiological information. Fact Sheet for Patients:   BoilerBrush.com.cy Fact Sheet for Healthcare  Providers: https://pope.com/ This test is not yet approved or cleared  by the [place] FDA and has been authorized for detection and/or diagnosis of SARS-CoV-2 by FDA under an Emergency Use Authorization (EUA).  This EUA will remain in effect (meaning this test can be used) for the duration of the COVID-19 declaration under Section 564(b)(1) of the Act, 21 U.S.C. section 360bbb-3(b)(1), unless the authorization is terminated or revoked sooner. Performed at [facility], [address] [place], [place] [address]      Radiological Exams on Admission: CT Renal Stone Study  Result Date: 05/17/2020 CLINICAL DATA:  Left flank and groin pain EXAM: CT ABDOMEN AND PELVIS WITHOUT CONTRAST TECHNIQUE: Multidetector CT imaging of the abdomen and pelvis was performed following the standard protocol without IV contrast. COMPARISON:  Ultrasound 10/29/2018 FINDINGS: Lower chest: No acute abnormality. Hepatobiliary: 1 cm hypodense lesion in the posterior right hepatic lobe likely a cyst. No gallstones, gallbladder wall thickening, or biliary dilatation. Pancreas: Unremarkable. No pancreatic ductal dilatation or surrounding inflammatory changes. Spleen: Normal in size without focal abnormality. Adrenals/Urinary Tract: Adrenal glands are normal. Hypodense lesion mid pole left kidney, likely a cyst. Mild left hydronephrosis and hydroureter, secondary to a 5 mm stone in the distal left ureter at approximate S3 level. Bladder is otherwise unremarkable Stomach/Bowel: Stomach is within normal limits. Appendix appears normal. No evidence of bowel wall thickening, distention, or inflammatory changes. Vascular/Lymphatic: No significant vascular findings are present. No enlarged abdominal or pelvic lymph nodes. Reproductive: Prostate is unremarkable. Other: No abdominal wall hernia or abnormality. No abdominopelvic ascites. Musculoskeletal: No acute or significant osseous findings.  IMPRESSION: 1. Mild left hydronephrosis and hydroureter, secondary to a 5 mm distal left ureteral stone at the mid sacral level. 2. Hypodense lesions in the liver and left kidney, likely cysts Electronically Signed   By: Jasmine Pang M.D.   On: 05/17/2020 20:10     EKG: Not done in ED, will get one.   Assessment/Plan Principal Problem:   Left ureteral stone Active Problems:   Gout   Hydronephrosis of left kidney   CKD (chronic kidney disease), stage II   Hypokalemia   Hypothyroidism   Left ureteral stone and hydronephrosis of left kidney: Urologist, Dr. Lonna Cobb is consulted. -place on med-surg bed for obs -f/u urologist recommendation -Pain control: As needed Dilaudid and Percocet -IV fluid: 125 cc/h NS -Continue Flomax  Gout: -continue allopurinol and colchicine  CKD (chronic kidney disease), stage II: stable -f/u by BMP  Hypokalemia: K=  3.4 on admission. - Repleted - Check Mg level  Hypothyroidism: Last TSH was on -Continue home Synthroid      DVT ppx: SCD Code Status: Full code Family Communication:   Yes, patient's wife at bed side Disposition Plan:  Anticipate discharge back to previous home environment Consults called:  Dr. Lonna Cobb of urology Admission status: Med-surg bed for obs        Status is: Observation  The patient remains OBS appropriate and will d/c before 2 midnights.  Dispo: The patient is from: Home              Anticipated d/c is to: Home              Anticipated d/c date is: 1 day              Patient currently is not medically stable to d/c.              Date of Service 05/19/2020    Lorretta Harp [facility]   If 7PM-7AM, please contact night-coverage [email or web address] 05/19/2020, 7:29 PM

## 2020-05-19 NOTE — Consult Note (Signed)
Urology Consult  Requesting physician: Jene Every, MD  Reason for consultation: Intractable renal colic  Chief Complaint: Flank pain  History of Present Illness: Pedro Elliott is a 39 y.o. male who initially presented to the [facility] ED on 05/17/2020 with a several hour history of left groin pain which subsequently radiated to the left lower quadrant and left flank.  The pain was rated severe without identifiable precipitating, aggravating or alleviating factors.  He had no voiding symptoms and denied dysuria, gross hematuria.  Denied fever, chills.  He had nausea without vomiting.  A stone protocol CT was performed which showed a 5 mm left distal ureteral calculus with mild hydronephrosis. No other calculi were noted.  No prior history of stone disease  His pain was controlled with parenteral analgesia and he was discharged on oxycodone, tamsulosin and Zofran.  His pain was improved after discharge however last night he had recurrent pain which he described as severe and not controlled with p.o. analgesia.  Denies fever or chills.  His pain was unable to be controlled in the ED and he was admitted to the hospitalist service for parenteral analgesia.  At the time I saw him his pain had resolved and he was comfortable.  Past medical history remarkable for gout on allopurinol.  Past Medical History:  Diagnosis Date  . Gout   . Hypothyroidism     Past Surgical History:  Procedure Laterality Date  . HERNIA REPAIR    . TONSILLECTOMY      Home Medications:  Current Meds  Medication Sig  . allopurinol (ZYLOPRIM) 100 MG tablet Take 200 mg by mouth daily.  . colchicine 0.6 MG tablet Take 2 tablets (1.2 mg total) by mouth once for 1 dose. If pain persists after 1 hour, take 1 additional pill. No more than 3 pills in 24 hours.  Marland Kitchen ibuprofen (ADVIL) 800 MG tablet Take 800 mg by mouth every 8 (eight) hours as needed.  Marland Kitchen levothyroxine (SYNTHROID) 50 MCG tablet Take 50 mcg by mouth daily.    . Multiple Vitamin (MULTI-VITAMIN) tablet Take 1 tablet by mouth daily.  . ondansetron (ZOFRAN ODT) 4 MG disintegrating tablet Take 1 tablet (4 mg total) by mouth every 8 (eight) hours as needed for nausea or vomiting.  Marland Kitchen oxyCODONE-acetaminophen (PERCOCET) 5-325 MG tablet Take 1 tablet by mouth every 4 (four) hours as needed for severe pain.  . tamsulosin (FLOMAX) 0.4 MG CAPS capsule Take 1 capsule (0.4 mg total) by mouth daily after breakfast.    Allergies:  Allergies  Allergen Reactions  . Other Anaphylaxis    cats    No family history on file.  Social History:  reports that he has never smoked. He has never used smokeless tobacco. No history on file for alcohol and drug.  ROS: A complete review of systems was performed.  All systems are negative except for pertinent findings as noted.  Physical Exam:  Vital signs in last 24 hours: Temp:  [98 F (36.7 C)] 98 F (36.7 C) (05/21 0927) Pulse Rate:  [69-74] 69 (05/21 1445) Resp:  [18] 18 (05/21 1445) BP: (116-134)/(79-80) 116/80 (05/21 1445) SpO2:  [98 %-100 %] 98 % (05/21 1445) Weight:  [601 kg] 106 kg (05/21 0928) Constitutional:  Alert and oriented, No acute distress HEENT: [place] AT, moist mucus membranes.  Trachea midline, no masses Cardiovascular: Regular rate and rhythm, no clubbing, cyanosis, or edema. Respiratory: Normal respiratory effort, lungs clear bilaterally Neurologic: Grossly intact, no focal deficits, moving all  4 extremities Psychiatric: Normal mood and affect   Laboratory Data:  Recent Labs    05/17/20 1950 05/19/20 0940  WBC 6.9 11.9*  HGB 14.2 14.3  HCT 41.3 42.7   Recent Labs    05/17/20 1950 05/19/20 0940  NA 140 138  K 3.6 3.4*  CL 106 106  CO2 26 23  GLUCOSE 120* 119*  BUN 13 10  CREATININE 1.35* 1.39*  CALCIUM 9.5 9.2   Recent Labs    05/19/20 1439  INR 1.0   No results for input(s): LABURIN in the last 72 hours. Results for orders placed or performed during the hospital  encounter of 05/19/20  SARS Coronavirus 2 by RT PCR (hospital order, performed in [facility] hospital lab) Nasopharyngeal Nasopharyngeal Swab     Status: None   Collection Time: 05/19/20 12:54 PM   Specimen: Nasopharyngeal Swab  Result Value Ref Range Status   SARS Coronavirus 2 NEGATIVE NEGATIVE Final    Comment: (NOTE) SARS-CoV-2 target nucleic acids are NOT DETECTED. The SARS-CoV-2 RNA is generally detectable in upper and lower respiratory specimens during the acute phase of infection. The lowest concentration of SARS-CoV-2 viral copies this assay can detect is 250 copies / mL. A negative result does not preclude SARS-CoV-2 infection and should not be used as the sole basis for treatment or other patient management decisions.  A negative result may occur with improper specimen collection / handling, submission of specimen other than nasopharyngeal swab, presence of viral mutation(s) within the areas targeted by this assay, and inadequate number of viral copies (<250 copies / mL). A negative result must be combined with clinical observations, patient history, and epidemiological information. Fact Sheet for Patients:   StrictlyIdeas.no Fact Sheet for Healthcare Providers: BankingDealers.co.za This test is not yet approved or cleared  by the [place] FDA and has been authorized for detection and/or diagnosis of SARS-CoV-2 by FDA under an Emergency Use Authorization (EUA).  This EUA will remain in effect (meaning this test can be used) for the duration of the COVID-19 declaration under Section 564(b)(1) of the Act, 21 U.S.C. section 360bbb-3(b)(1), unless the authorization is terminated or revoked sooner. Performed at [facility], [address] [place], [place] [address]      Radiologic Imaging: CT was personally reviewed  CT Renal Stone Study  Result Date: 05/17/2020 CLINICAL DATA:  Left flank and groin pain  EXAM: CT ABDOMEN AND PELVIS WITHOUT CONTRAST TECHNIQUE: Multidetector CT imaging of the abdomen and pelvis was performed following the standard protocol without IV contrast. COMPARISON:  Ultrasound 10/29/2018 FINDINGS: Lower chest: No acute abnormality. Hepatobiliary: 1 cm hypodense lesion in the posterior right hepatic lobe likely a cyst. No gallstones, gallbladder wall thickening, or biliary dilatation. Pancreas: Unremarkable. No pancreatic ductal dilatation or surrounding inflammatory changes. Spleen: Normal in size without focal abnormality. Adrenals/Urinary Tract: Adrenal glands are normal. Hypodense lesion mid pole left kidney, likely a cyst. Mild left hydronephrosis and hydroureter, secondary to a 5 mm stone in the distal left ureter at approximate S3 level. Bladder is otherwise unremarkable Stomach/Bowel: Stomach is within normal limits. Appendix appears normal. No evidence of bowel wall thickening, distention, or inflammatory changes. Vascular/Lymphatic: No significant vascular findings are present. No enlarged abdominal or pelvic lymph nodes. Reproductive: Prostate is unremarkable. Other: No abdominal wall hernia or abnormality. No abdominopelvic ascites. Musculoskeletal: No acute or significant osseous findings. IMPRESSION: 1. Mild left hydronephrosis and hydroureter, secondary to a 5 mm distal left ureteral stone at the mid sacral level. 2.  Hypodense lesions in the liver and left kidney, likely cysts Electronically Signed   By: Jasmine Pang M.D.   On: 05/17/2020 20:10    Impression/Assessment:  39 y.o. male with a 5 mm left distal ureteral calculus and difficult to control renal colic admitted for analgesia.  He is presently comfortable.  Management options were discussed.  Medical expulsion therapy was discussed and he was informed there is an 80+ percent chance his stone will pass based on size and location.  Ureteroscopic removal was discussed including the need for postop stent and the  possibility of stent irritative symptoms.  Shockwave lithotripsy was also reviewed however would not be available until next week.   Recommendation:  -If pain improves and can be controlled with oral analgesics then discharge and a brief trial of medical expulsion therapy is appropriate  -Continue tamsulosin  -Strain urine  -KUB ordered to evaluate for distal stone progression  -In the event he continues to have severe colic with regular parenteral analgesia will make him n.p.o. after midnight.   05/19/2020, 4:01 PM  Irineo Axon,  MD

## 2020-05-19 NOTE — ED Triage Notes (Addendum)
Left flank pain, dx with kidney stone on Wednesday night in ED. Pt with increased pain this AM, pt appears very uncomfortable. Nauseated. Able to urinate this AM but small amount per patient. Taking prescribed pain medication and flomax.

## 2020-05-19 NOTE — ED Notes (Signed)
meds given for nausea.   Pt pale  Iv fluids infusing.

## 2020-05-19 NOTE — ED Notes (Signed)
Report called to [facility] rn floor nurse

## 2020-05-19 NOTE — ED Notes (Signed)
Assigned bed @ 1850, spoke with RN Amy.

## 2020-05-19 NOTE — ED Provider Notes (Signed)
[facility] Emergency Department Provider Note   ____________________________________________    I have reviewed the triage vital signs and the nursing notes.   HISTORY  Chief Complaint Flank Pain     HPI Pedro Elliott is a 39 y.o. male who presents with complaints of left flank pain.  Patient was diagnosed yesterday with 5 mm distal left ureteral stone.  Pain was improved after emergency department visit yesterday however worsened severely overnight.  He reports medications have not been helping.  Does report nausea as well.  No fevers or chills.  No dysuria.  This is his first kidney stone.  Has not been able to follow-up with urology yet.  Past Medical History:  Diagnosis Date  . Gout   . Hypothyroidism     Patient Active Problem List   Diagnosis Date Noted  . Gout   . Hydronephrosis of left kidney   . Left ureteral stone   . CKD (chronic kidney disease), stage II   . Hypokalemia     Past Surgical History:  Procedure Laterality Date  . HERNIA REPAIR    . TONSILLECTOMY      Prior to Admission medications   Medication Sig Start Date End Date Taking? Authorizing Provider  allopurinol (ZYLOPRIM) 100 MG tablet Take 200 mg by mouth daily. 02/10/20  Yes [provider]  colchicine 0.6 MG tablet Take 2 tablets (1.2 mg total) by mouth once for 1 dose. If pain persists after 1 hour, take 1 additional pill. No more than 3 pills in 24 hours. 05/03/18 05/19/20 Yes Triplett, Cari B, FNP  ibuprofen (ADVIL) 800 MG tablet Take 800 mg by mouth every 8 (eight) hours as needed.   Yes [provider]  levothyroxine (SYNTHROID) 50 MCG tablet Take 50 mcg by mouth daily. 01/06/20  Yes [provider]  Multiple Vitamin (MULTI-VITAMIN) tablet Take 1 tablet by mouth daily.   Yes [provider]  ondansetron (ZOFRAN ODT) 4 MG disintegrating tablet Take 1 tablet (4 mg total) by mouth every 8 (eight) hours as needed for nausea or vomiting.  05/17/20  Yes Chesley Noon, MD  oxyCODONE-acetaminophen (PERCOCET) 5-325 MG tablet Take 1 tablet by mouth every 4 (four) hours as needed for severe pain. 05/17/20 05/17/21 Yes Chesley Noon, MD  tamsulosin [facility]) 0.4 MG CAPS capsule Take 1 capsule (0.4 mg total) by mouth daily after breakfast. 05/17/20  Yes Chesley Noon, MD     Allergies Other  No family history on file.  Social History Social History   Tobacco Use  . Smoking status: Never Smoker  . Smokeless tobacco: Never Used  Substance Use Topics  . Alcohol use: Not on file  . Drug use: Not on file    Review of Systems  Constitutional: No fever/chills Eyes: No visual changes.  ENT: No sore throat. Cardiovascular: Denies chest pain. Respiratory: Denies shortness of breath. Gastrointestinal: As above Genitourinary: As above Musculoskeletal: Negative for back pain. Skin: Negative for rash. Neurological: Negative for headaches or weakness   ____________________________________________   PHYSICAL EXAM:  VITAL SIGNS: ED Triage Vitals  Enc Vitals Group     BP 05/19/20 0927 134/79     Pulse Rate 05/19/20 0927 74     Resp 05/19/20 0927 18     Temp 05/19/20 0927 98 F (36.7 C)     Temp Source 05/19/20 0927 Oral     SpO2 05/19/20 0927 100 %     Weight 05/19/20 0928 106 kg (233 lb 11 oz)  Height 05/19/20 0928 1.854 m (6\' 1" )     Head Circumference --      Peak Flow --      Pain Score 05/19/20 0928 10     Pain Loc --      Pain Edu? --      Excl. in [place]? --     Constitutional: Alert and oriented.e  Nose: No congestion/rhinnorhea. Mouth/Throat: Mucous membranes are moist.   Neck:  Painless ROM Cardiovascular: Normal rate, regular rhythm. Grossly normal heart sounds.  Good peripheral circulation. Respiratory: Normal respiratory effort.  No retractions. Lungs CTAB. Gastrointestinal: Soft and nontender. No distention.  Mild left CVA tenderness Genitourinary: deferred Musculoskeletal:   Warm and well  perfused Neurologic:  Normal speech and language. No gross focal neurologic deficits are appreciated.  Skin:  Skin is warm, dry and intact. No rash noted. Psychiatric: Mood and affect are normal. Speech and behavior are normal.  ____________________________________________   LABS (all labs ordered are listed, but only abnormal results are displayed)  Labs Reviewed  BASIC METABOLIC PANEL - Abnormal; Notable for the following components:      Result Value   Potassium 3.4 (*)    Glucose, Bld 119 (*)    Creatinine, Ser 1.39 (*)    All other components within normal limits  CBC - Abnormal; Notable for the following components:   WBC 11.9 (*)    All other components within normal limits  URINALYSIS, COMPLETE (UACMP) WITH MICROSCOPIC - Abnormal; Notable for the following components:   Color, Urine STRAW (*)    APPearance CLEAR (*)    Hgb urine dipstick SMALL (*)    All other components within normal limits  SARS CORONAVIRUS 2 BY RT PCR (HOSPITAL ORDER, [place] [facility])  PROTIME-INR  APTT   ____________________________________________  EKG  None ____________________________________________  RADIOLOGY  CT reviewed from yesterday, 5 mm distal ureteral stone ____________________________________________   PROCEDURES  Procedure(s) performed: No  Procedures   Critical Care performed: No ____________________________________________   INITIAL IMPRESSION / ASSESSMENT AND PLAN / ED COURSE  Pertinent labs & imaging results that were available during my care of the patient were reviewed by me and considered in my medical decision making (see chart for details).  Patient with known 5 mm left ureteral stone presents with severe left flank pain consistent with kidney stone pain.  Afebrile, reassuring vitals.  Urinalysis unremarkable.  Lab work is overall reassuring.  Patient with continued pain has required multiple doses of IV Dilaudid.  Discussed with  Dr. Bernardo Heater of urology who will consult on the patient, he recommended admission to medicine for pain control      ____________________________________________   FINAL CLINICAL IMPRESSION(S) / ED DIAGNOSES  Final diagnoses:  Kidney stone        Note:  This document was prepared using Dragon voice recognition software and may include unintentional dictation errors.   Lavonia Drafts, MD 05/19/20 1440

## 2020-05-19 NOTE — ED Notes (Signed)
States feeling better after meds given for nausea.

## 2020-05-19 NOTE — ED Notes (Signed)
Per Dr. Clyde Lundborg, dilaudid dose can be given early

## 2020-05-20 ENCOUNTER — Observation Stay: Payer: Medicaid Other | Admitting: Anesthesiology

## 2020-05-20 ENCOUNTER — Encounter
Admission: EM | Disposition: A | Discharge status: Home / Self Care | Payer: Self-pay | Source: Home / Self Care | Attending: Emergency Medicine

## 2020-05-20 ENCOUNTER — Observation Stay: Payer: Medicaid Other

## 2020-05-20 DIAGNOSIS — N201 Calculus of ureter: Secondary | ICD-10-CM | POA: Diagnosis not present

## 2020-05-20 HISTORY — PX: CYSTOSCOPY/RETROGRADE/URETEROSCOPY: SHX5316

## 2020-05-20 HISTORY — PX: CYSTOSCOPY WITH STENT PLACEMENT: SHX5790

## 2020-05-20 LAB — CBC
HCT: 40.3 % (ref 39.0–52.0)
Hemoglobin: 13.2 g/dL (ref 13.0–17.0)
MCH: 30.3 pg (ref 26.0–34.0)
MCHC: 32.8 g/dL (ref 30.0–36.0)
MCV: 92.4 fL (ref 80.0–100.0)
Platelets: 164 10*3/uL (ref 150–400)
RBC: 4.36 MIL/uL (ref 4.22–5.81)
RDW: 12.7 % (ref 11.5–15.5)
WBC: 10.1 10*3/uL (ref 4.0–10.5)
nRBC: 0 % (ref 0.0–0.2)

## 2020-05-20 LAB — MAGNESIUM: Magnesium: 1.7 mg/dL (ref 1.7–2.4)

## 2020-05-20 LAB — BASIC METABOLIC PANEL
Anion gap: 6 (ref 5–15)
BUN: 14 mg/dL (ref 6–20)
CO2: 26 mmol/L (ref 22–32)
Calcium: 8.6 mg/dL — ABNORMAL LOW (ref 8.9–10.3)
Chloride: 107 mmol/L (ref 98–111)
Creatinine, Ser: 2.02 mg/dL — ABNORMAL HIGH (ref 0.61–1.24)
GFR calc Af Amer: 47 mL/min — ABNORMAL LOW (ref >60)
GFR calc non Af Amer: 41 mL/min — ABNORMAL LOW (ref >60)
Glucose, Bld: 105 mg/dL — ABNORMAL HIGH (ref 70–99)
Potassium: 3.8 mmol/L (ref 3.5–5.1)
Sodium: 139 mmol/L (ref 135–145)

## 2020-05-20 LAB — GLUCOSE, CAPILLARY: Glucose-Capillary: 90 mg/dL (ref 70–99)

## 2020-05-20 LAB — HIV ANTIBODY (ROUTINE TESTING W REFLEX): HIV Screen 4th Generation wRfx: NONREACTIVE

## 2020-05-20 SURGERY — CYSTOSCOPY/RETROGRADE/URETEROSCOPY
Anesthesia: General | Site: Ureter | Laterality: Left

## 2020-05-20 MED ORDER — PROPOFOL 10 MG/ML IV BOLUS
INTRAVENOUS | Status: DC | PRN
  Administered 2020-05-20: 200 mg via INTRAVENOUS

## 2020-05-20 MED ORDER — OXYCODONE HCL 5 MG PO TABS: 5.0000 mg | ORAL_TABLET | Freq: Once | ORAL | Status: DC | PRN

## 2020-05-20 MED ORDER — PROPOFOL 500 MG/50ML IV EMUL
INTRAVENOUS | Status: AC
  Filled 2020-05-20: qty 50

## 2020-05-20 MED ORDER — FENTANYL CITRATE (PF) 100 MCG/2ML IJ SOLN
INTRAMUSCULAR | Status: AC
  Filled 2020-05-20: qty 2

## 2020-05-20 MED ORDER — OXYCODONE-ACETAMINOPHEN 5-325 MG PO TABS: 1.0000 | ORAL_TABLET | ORAL | 0 refills | Status: DC | PRN

## 2020-05-20 MED ORDER — CEFAZOLIN SODIUM-DEXTROSE 2-4 GM/100ML-% IV SOLN
2.0000 g | Freq: Once | INTRAVENOUS | Status: AC
  Administered 2020-05-20: 2 g via INTRAVENOUS
  Filled 2020-05-20: qty 100

## 2020-05-20 MED ORDER — ADULT MULTIVITAMIN W/MINERALS CH
1.0000 | ORAL_TABLET | Freq: Every day | ORAL | Status: DC
  Administered 2020-05-20: 08:00:00 1 via ORAL
  Filled 2020-05-20: qty 1

## 2020-05-20 MED ORDER — COLCHICINE 0.6 MG PO TABS: 1.2000 mg | ORAL_TABLET | Freq: Once | ORAL | Status: DC

## 2020-05-20 MED ORDER — ONDANSETRON HCL 4 MG/2ML IJ SOLN
INTRAMUSCULAR | Status: DC | PRN
  Administered 2020-05-20: 4 mg via INTRAVENOUS

## 2020-05-20 MED ORDER — MIDAZOLAM HCL 2 MG/2ML IJ SOLN
INTRAMUSCULAR | Status: AC
  Filled 2020-05-20: qty 2

## 2020-05-20 MED ORDER — IOHEXOL 180 MG/ML  SOLN
INTRAMUSCULAR | Status: DC | PRN
  Administered 2020-05-20: 20 mL

## 2020-05-20 MED ORDER — LIDOCAINE HCL (CARDIAC) PF 100 MG/5ML IV SOSY
PREFILLED_SYRINGE | INTRAVENOUS | Status: DC | PRN
  Administered 2020-05-20: 100 mg via INTRAVENOUS

## 2020-05-20 MED ORDER — LEVOTHYROXINE SODIUM 50 MCG PO TABS
50.0000 ug | ORAL_TABLET | Freq: Every day | ORAL | Status: DC
  Administered 2020-05-20: 50 ug via ORAL
  Filled 2020-05-20: qty 1

## 2020-05-20 MED ORDER — FENTANYL CITRATE (PF) 100 MCG/2ML IJ SOLN: 25.0000 ug | INTRAMUSCULAR | Status: DC | PRN

## 2020-05-20 MED ORDER — SEVOFLURANE IN SOLN
RESPIRATORY_TRACT | Status: AC
  Filled 2020-05-20: qty 250

## 2020-05-20 MED ORDER — PROPOFOL 500 MG/50ML IV EMUL
INTRAVENOUS | Status: DC | PRN
Start: 2020-05-20 — End: 2020-05-20
  Administered 2020-05-20: 200 ug/kg/min via INTRAVENOUS

## 2020-05-20 MED ORDER — KETOROLAC TROMETHAMINE 30 MG/ML IJ SOLN
INTRAMUSCULAR | Status: DC | PRN
  Administered 2020-05-20: 30 mg via INTRAVENOUS

## 2020-05-20 MED ORDER — LACTATED RINGERS IV SOLN: INTRAVENOUS | Status: DC | PRN

## 2020-05-20 MED ORDER — OXYCODONE-ACETAMINOPHEN 5-325 MG PO TABS: 1.0000 | ORAL_TABLET | ORAL | Status: DC | PRN

## 2020-05-20 MED ORDER — DEXAMETHASONE SODIUM PHOSPHATE 10 MG/ML IJ SOLN
INTRAMUSCULAR | Status: DC | PRN
  Administered 2020-05-20: 10 mg via INTRAVENOUS

## 2020-05-20 MED ORDER — MIDAZOLAM HCL 2 MG/2ML IJ SOLN
INTRAMUSCULAR | Status: DC | PRN
  Administered 2020-05-20: 2 mg via INTRAVENOUS

## 2020-05-20 MED ORDER — ONDANSETRON HCL 4 MG/2ML IJ SOLN: 4.0000 mg | Freq: Once | INTRAMUSCULAR | Status: DC | PRN

## 2020-05-20 MED ORDER — TAMSULOSIN HCL 0.4 MG PO CAPS: 0.4000 mg | ORAL_CAPSULE | Freq: Every day | ORAL | Status: DC

## 2020-05-20 MED ORDER — SENNOSIDES-DOCUSATE SODIUM 8.6-50 MG PO TABS
1.0000 | ORAL_TABLET | Freq: Every evening | ORAL | Status: DC | PRN
  Administered 2020-05-20: 17:00:00 1 via ORAL
  Filled 2020-05-20: qty 1

## 2020-05-20 MED ORDER — FENTANYL CITRATE (PF) 100 MCG/2ML IJ SOLN
INTRAMUSCULAR | Status: DC | PRN
  Administered 2020-05-20 (×2): 25 ug via INTRAVENOUS
  Administered 2020-05-20: 50 ug via INTRAVENOUS

## 2020-05-20 MED ORDER — ALLOPURINOL 100 MG PO TABS
200.0000 mg | ORAL_TABLET | Freq: Every day | ORAL | Status: DC
  Administered 2020-05-20: 200 mg via ORAL
  Filled 2020-05-20: qty 2

## 2020-05-20 MED ORDER — OXYCODONE HCL 5 MG/5ML PO SOLN: 5.0000 mg | Freq: Once | ORAL | Status: DC | PRN

## 2020-05-20 SURGICAL SUPPLY — 19 items
BAG DRAIN CYSTO-URO LG1000N (MISCELLANEOUS) ×2 IMPLANT
BASKET ZERO TIP 1.9FR (BASKET) ×2 IMPLANT
CATH URETL 5X70 OPEN END (CATHETERS) ×2 IMPLANT
CATH URETL OPEN END 6X70 (CATHETERS) ×2 IMPLANT
FIBER LASER FLEXIVA 365 (UROLOGICAL SUPPLIES) ×2 IMPLANT
GLOVE BIO SURGEON STRL SZ7.5 (GLOVE) ×2 IMPLANT
GLOVE BIO SURGEON STRL SZ8 (GLOVE) ×2 IMPLANT
GOWN STRL REUS W/ TWL LRG LVL3 (GOWN DISPOSABLE) ×1 IMPLANT
GOWN STRL REUS W/ TWL XL LVL3 (GOWN DISPOSABLE) ×1 IMPLANT
GOWN STRL REUS W/TWL LRG LVL3 (GOWN DISPOSABLE) ×1
GOWN STRL REUS W/TWL XL LVL3 (GOWN DISPOSABLE) ×1
GUIDEWIRE STR ZIPWIRE 035X150 (MISCELLANEOUS) ×2 IMPLANT
PACK CYSTO AR (MISCELLANEOUS) ×2 IMPLANT
SET CYSTO W/LG BORE CLAMP LF (SET/KITS/TRAYS/PACK) ×2 IMPLANT
SOL .9 NS 3000ML IRR  AL (IV SOLUTION) ×1
SOL .9 NS 3000ML IRR UROMATIC (IV SOLUTION) ×1 IMPLANT
STENT URET 6FRX24 CONTOUR (STENTS) IMPLANT
STENT URET 6FRX26 CONTOUR (STENTS) ×2 IMPLANT
WATER STERILE IRR 1000ML POUR (IV SOLUTION) ×2 IMPLANT

## 2020-05-20 NOTE — Op Note (Signed)
Preoperative diagnosis: Left ureteral stone Postoperative diagnosis: Left ureteral stone  Procedure: Cystoscopy with left retrograde pyelogram, left ureteroscopy, laser lithotripsy, stone basket extraction, stent placement with string  Surgeon: Mena Goes  Anesthesia: General  Indication for procedure: Pedro Elliott is a 39 year old male who had a left distal stone that failed to pass.  He had uncontrolled pain and rising creatinine.  Brought for procedure today.  Findings: On ureteroscopy the stone was in the left distal ureter at the UVJ, it was fragmented and the pieces dropped in the bladder.  Otherwise ureter, bladder, urethra unremarkable.  Left retrograde pyelogram-this outlined a filling defect in the left distal ureter consistent with the stone with some mild proximal dilation.  Description of procedure: After consent was obtained patient brought to the operating room.  After adequate anesthesia was placed lithotomy position and prepped and draped in the usual sterile fashion.  A timeout was performed to confirm the patient and procedure.  The cystoscope was passed per urethra and the bladder carefully inspected.  Left ureteral orifice was cannulated with 5 [place] open-ended catheter.  It was difficult to get contrast proximally.  Sensor wire was advanced and needed the catheter to brace it and guide it proximally.  Single channel semirigid ureteroscope was then advanced adjacent to the wire where the stone was encountered.  It was fragmented into about 4 pieces with a 365 m laser fiber.  The pieces were sequentially dropped in the bladder with a 0 tip basket.  Final inspection all the way up to the iliacs noted the ureter to be normal no other stones or stone fragments.  The wire was then backloaded on the cystoscope and a 6 x 26 cm stent advanced.  Wire was removed with a good coil seen in the collecting system and a good coil in the bladder.  The stone fragments were evacuated and sent with the  patient.  He was then awakened taken recovery room in stable condition.  Complications: None  Blood loss: Minimal  Specimens: None  Drains: 6 x 26 cm left ureteral stent with string  Disposition: Patient stable to PACU -I called and discussed with [name] the procedure, postop care and stent removal, follow-up.

## 2020-05-20 NOTE — Anesthesia Postprocedure Evaluation (Signed)
Anesthesia Post Note  Patient: Pedro Elliott  Procedure(s) Performed: CYSTOSCOPY/RETROGRADE/URETEROSCOPY (Left Ureter) CYSTOSCOPY WITH STENT PLACEMENT (Left Ureter)  Patient location during evaluation: PACU Anesthesia Type: General Level of consciousness: awake and alert Pain management: pain level controlled Vital Signs Assessment: post-procedure vital signs reviewed and stable Respiratory status: spontaneous breathing, nonlabored ventilation and respiratory function stable Cardiovascular status: blood pressure returned to baseline and stable Postop Assessment: no apparent nausea or vomiting Anesthetic complications: no     Last Vitals:  Vitals:   05/20/20 1308 05/20/20 1417  BP: 107/62 118/80  Pulse: 90 74  Resp: 11 18  Temp:  36.8 C  SpO2: 100% 99%    Last Pain:  Vitals:   05/20/20 1417  TempSrc: Oral  PainSc:                  Karleen Hampshire

## 2020-05-20 NOTE — Progress Notes (Signed)
After returning from procedure; MEWS score decreased back to 0.   05/20/20 1417  Assess: MEWS Score  Temp 98.2 F (36.8 C)  BP 118/80  Pulse Rate 74  Resp 18  Level of Consciousness Alert  SpO2 99 %  O2 Device Room Air  Assess: MEWS Score  MEWS Temp 0  MEWS Systolic 0  MEWS Pulse 0  MEWS RR 0  MEWS LOC 0  MEWS Score 0  MEWS Score Color Green

## 2020-05-20 NOTE — Progress Notes (Addendum)
Patient noticed he was missing $60 (x3  $20) out of his wallet.  Charge nurse, [facility] and security was called.   AD will follow up with patient on Monday.   Security came up to talk to patient.  Admission was not complete prior to discharge.  Patient had clothing, shoes, wallet, [employer], phone and charger, and some snack at bedside at this time.   Per pt his wallet never left his side until he left for procedure today.  He checked all pockets while security, charge and myself were in the room.

## 2020-05-20 NOTE — Progress Notes (Signed)
I spoke with Nurse and pt has not seen stone pass, having pain and urgency to void. Added on for left ureteroscopy later this morning. Full updated progress note to follow.

## 2020-05-20 NOTE — Plan of Care (Signed)
  Problem: Activity: Goal: Ability to return to normal activity level will improve to the fullest extent possible by discharge Outcome: Adequate for Discharge   Problem: Medication: Goal: Compliance with prescribed medication regimen will improve by discharge Outcome: Adequate for Discharge

## 2020-05-20 NOTE — Progress Notes (Signed)
15 minute call to floor. 

## 2020-05-20 NOTE — Plan of Care (Signed)
  Problem: Activity: Goal: Ability to return to normal activity level will improve to the fullest extent possible by discharge 05/20/2020 0053 by Baruch Merl, RN Reactivated 05/20/2020 0052 by Baruch Merl, RN Outcome: Adequate for Discharge

## 2020-05-20 NOTE — Progress Notes (Signed)
Day of Surgery Subjective: Patient reports continued left LQ pain and urgency. Also left flank pain. No stone passage seen. Cr up to 2.   Objective: Vital signs in last 24 hours: Temp:  [97.7 F (36.5 C)-99 F (37.2 C)] 98.8 F (37.1 C) (05/22 0740) Pulse Rate:  [62-91] 88 (05/22 0740) Resp:  [15-18] 18 (05/22 0740) BP: (102-134)/(62-94) 134/86 (05/22 0740) SpO2:  [96 %-100 %] 99 % (05/22 0740)  Intake/Output from previous day: 05/21 0701 - 05/22 0700 In: 594.8 [I.V.:594.8] Out: 1500 [Urine:1500] Intake/Output this shift: No intake/output data recorded.  Physical Exam:  Looks well in pre-op area Alert and Ox  3 Sitting up in bed, clutching left side   Lab Results: Recent Labs    05/17/20 1950 05/19/20 0940 05/20/20 0612  HGB 14.2 14.3 13.2  HCT 41.3 42.7 40.3   BMET Recent Labs    05/19/20 0940 05/20/20 0612  NA 138 139  K 3.4* 3.8  CL 106 107  CO2 23 26  GLUCOSE 119* 105*  BUN 10 14  CREATININE 1.39* 2.02*  CALCIUM 9.2 8.6*   Recent Labs    05/19/20 1439  INR 1.0   No results for input(s): LABURIN in the last 72 hours. Results for orders placed or performed during the hospital encounter of 05/19/20  SARS Coronavirus 2 by RT PCR (hospital order, performed in [facility] hospital lab) Nasopharyngeal Nasopharyngeal Swab     Status: None   Collection Time: 05/19/20 12:54 PM   Specimen: Nasopharyngeal Swab  Result Value Ref Range Status   SARS Coronavirus 2 NEGATIVE NEGATIVE Final    Comment: (NOTE) SARS-CoV-2 target nucleic acids are NOT DETECTED. The SARS-CoV-2 RNA is generally detectable in upper and lower respiratory specimens during the acute phase of infection. The lowest concentration of SARS-CoV-2 viral copies this assay can detect is 250 copies / mL. A negative result does not preclude SARS-CoV-2 infection and should not be used as the sole basis for treatment or other patient management decisions.  A negative result may occur with improper  specimen collection / handling, submission of specimen other than nasopharyngeal swab, presence of viral mutation(s) within the areas targeted by this assay, and inadequate number of viral copies (<250 copies / mL). A negative result must be combined with clinical observations, patient history, and epidemiological information. Fact Sheet for Patients:   StrictlyIdeas.no Fact Sheet for Healthcare Providers: BankingDealers.co.za This test is not yet approved or cleared  by the [place] FDA and has been authorized for detection and/or diagnosis of SARS-CoV-2 by FDA under an Emergency Use Authorization (EUA).  This EUA will remain in effect (meaning this test can be used) for the duration of the COVID-19 declaration under Section 564(b)(1) of the Act, 21 U.S.C. section 360bbb-3(b)(1), unless the authorization is terminated or revoked sooner. Performed at [facility], [address] [place], [place] [address]     Studies/Results: DG Abd 1 View  Result Date: 05/19/2020 CLINICAL DATA:  39 year old male with left flank pain, obstructing distal left ureteral calculus on CT 2 days ago. EXAM: ABDOMEN - 1 VIEW COMPARISON:  CT Abdomen and Pelvis 05/17/2020. FINDINGS: Supine views of the abdomen and pelvis. There are now 2 calculi visible in the left hemipelvis, 1 of these was a pelvic phlebolith on the recent CT. And I suspect the more medial calculus today is the urinary stone now located at the ureterovesical junction. Negative visible bowel gas pattern. Negative abdominal visceral contours. No acute osseous abnormality identified. IMPRESSION:  Suspect interval migration of the distal left ureteral calculus to the ureterovesical junction. Electronically Signed   By: Odessa Fleming M.D.   On: 05/19/2020 19:35   DG OR UROLOGY CYSTO IMAGE ([facility] ONLY)  Result Date: 05/20/2020 There is no interpretation for this exam.  This order is for images  obtained during a surgical procedure.  Please See "Surgeries" Tab for more information regarding the procedure.   I reviewed CT and KUB images. Communicated with Dr. Lonna Cobb and Dr. Allena Katz.   Assessment/Plan: Left distal stone - I discussed with the patient the nature, potential benefits, risks and alternatives to cysto, left RGP, left URS/HLL/stent and possible need for staged procedure,  including side effects of the proposed treatment, the likelihood of the patient achieving the goals of the procedure, and any potential problems that might occur during the procedure or recuperation. All questions answered. Patient elects to proceed. Discussed post-op stent expectations, care.     LOS: 0 days   Jerilee Field 05/20/2020, 11:42 AM

## 2020-05-20 NOTE — Discharge Instructions (Signed)
Ureteral Stent Implantation, Care After This sheet gives you information about how to care for yourself after your procedure. Your health care provider may also give you more specific instructions. If you have problems or questions, contact your health care provider.  Removal of the stent: Remove the stent by pulling the string on Tuesday morning, May 25th 2021 as instructed  What can I expect after the procedure? After the procedure, it is common to have:  Nausea.  Mild pain when you urinate. You may feel this pain in your lower back or lower abdomen. The pain should stop within a few minutes after you urinate. This may last for up to 1 week.  A small amount of blood in your urine for several days. Follow these instructions at home: Medicines  Take over-the-counter and prescription medicines only as told by your health care provider.  If you were prescribed an antibiotic medicine, take it as told by your health care provider. Do not stop taking the antibiotic even if you start to feel better.  Do not drive for 24 hours if you were given a sedative during your procedure.  Ask your health care provider if the medicine prescribed to you requires you to avoid driving or using heavy machinery. Activity  Rest as told by your health care provider.  Avoid sitting for a long time without moving. Get up to take short walks every 1-2 hours. This is important to improve blood flow and breathing. Ask for help if you feel weak or unsteady.  Return to your normal activities as told by your health care provider. Ask your health care provider what activities are safe for you. General instructions   Watch for any blood in your urine. Call your health care provider if the amount of blood in your urine increases.  If you have a catheter: ? Follow instructions from your health care provider about taking care of your catheter and collection bag. ? Do not take baths, swim, or use a hot tub until your  health care provider approves. Ask your health care provider if you may take showers. You may only be allowed to take sponge baths.  Drink enough fluid to keep your urine pale yellow.  Do not use any products that contain nicotine or tobacco, such as cigarettes, e-cigarettes, and chewing tobacco. These can delay healing after surgery. If you need help quitting, ask your health care provider.  Keep all follow-up visits as told by your health care provider. This is important. Contact a health care provider if:  You have pain that gets worse or does not get better with medicine, especially pain when you urinate.  You have difficulty urinating.  You feel nauseous or you vomit repeatedly during a period of more than 2 days after the procedure. Get help right away if:  Your urine is dark red or has blood clots in it.  You are leaking urine (have incontinence).  The end of the stent comes out of your urethra.  You cannot urinate.  You have sudden, sharp, or severe pain in your abdomen or lower back.  You have a fever.  You have swelling or pain in your legs.  You have difficulty breathing. Summary  After the procedure, it is common to have mild pain when you urinate that goes away within a few minutes after you urinate. This may last for up to 1 week.  Watch for any blood in your urine. Call your health care provider if the amount of  blood in your urine increases.  Take over-the-counter and prescription medicines only as told by your health care provider.  Drink enough fluid to keep your urine pale yellow. This information is not intended to replace advice given to you by your health care provider. Make sure you discuss any questions you have with your health care provider. Document Revised: 09/22/2018 Document Reviewed: 09/23/2018 Elsevier Patient Education  2020 [employer].

## 2020-05-20 NOTE — Discharge Summary (Signed)
[facility] [place] at [facility]   PATIENT NAME: Pedro Elliott    MR#:  [id number]  DATE OF BIRTH:  12/16/1981  DATE OF ADMISSION:  05/19/2020 ADMITTING PHYSICIAN: Lorretta Harp, MD  DATE OF DISCHARGE: 05/20/2020  PRIMARY CARE PHYSICIAN: Lazarus Gowda, MD    ADMISSION DIAGNOSIS:  Kidney stone [N20.0] Ureteral calculus [N20.1] Left ureteral stone [N20.1]  DISCHARGE DIAGNOSIS:  Left ureteral colic s/p stent placement with stone removal  SECONDARY DIAGNOSIS:   Past Medical History:  Diagnosis Date  . Gout   . Hypothyroidism     HOSPITAL COURSE:  Pedro Elliott is a 39 y.o. male with medical history significant of hypothyroidism, gout, CKD-2, who presents with left flank pain. Patient states that his left flank pain started at 5/19, which is constant, sharp, severe, radiating to the groin area.  Left ureteral stone and hydronephrosis of left kidney: Urologist, Dr. Lonna Cobb is consulted. -appreciate Dr Estil Daft input-- patient is status post cystoscopy with left retrograde below ground, left ureteral scopic, laser lithotripsy, stone basket extraction, stent placement with string on may 22nd -Pain control: As needed Dilaudid and Percocet -Continue Flomax -patient will follow-up with urology in six weeks.  Gout: -continue allopurinol and colchicine  CKD (chronic kidney disease), stage II: stable Pt advised fluid intake and f/u PCP next week for BMP  Hypokalemia: K= 3.4 on admission. - Repleted - Check Mg level  Hypothyroidism: Last TSH was on -Continue home Synthroid   patient tolerated procedure well. Hemodynamically stable. Will discharge to home later today. Okay with urology for discharge. Patient is agreeable with the plan as well.   DVT ppx: SCD Code Status: Full code Family Communication:   Yes, patient's wife was informed by urology Disposition Plan:  Anticipate discharge back to previous home environment Consults called:  Dr.  Binnie Kand of urology Admission status: Med-surg bed for obs        Status is: Observation  The patient remains OBS appropriate and will d/c before 2 midnights.  Dispo: The patient is from: Home  Anticipated d/c is to: Home  Anticipated d/c date is: may 22nd  Patient currently is  medically stable to d/c.     CONSULTS OBTAINED:  Treatment Team:  Riki Altes, MD  DRUG ALLERGIES:   Allergies  Allergen Reactions  . Other Anaphylaxis    cats    DISCHARGE MEDICATIONS:   Allergies as of 05/20/2020      Reactions   Other Anaphylaxis   cats      Medication List    TAKE these medications   allopurinol 100 MG tablet Commonly known as: ZYLOPRIM Take 200 mg by mouth daily.   colchicine 0.6 MG tablet Take 2 tablets (1.2 mg total) by mouth once for 1 dose. If pain persists after 1 hour, take 1 additional pill. No more than 3 pills in 24 hours.   ibuprofen 800 MG tablet Commonly known as: ADVIL Take 800 mg by mouth every 8 (eight) hours as needed.   levothyroxine 50 MCG tablet Commonly known as: SYNTHROID Take 50 mcg by mouth daily.   Multi-Vitamin tablet Take 1 tablet by mouth daily.   ondansetron 4 MG disintegrating tablet Commonly known as: Zofran ODT Take 1 tablet (4 mg total) by mouth every 8 (eight) hours as needed for nausea or vomiting.   oxyCODONE-acetaminophen 5-325 MG tablet Commonly known as: Percocet Take 1 tablet by mouth every 4 (four) hours as needed for severe pain.   tamsulosin 0.4 MG Caps  capsule Commonly known as: FLOMAX Take 1 capsule (0.4 mg total) by mouth daily after breakfast.       If you experience worsening of your admission symptoms, develop shortness of breath, life threatening emergency, suicidal or homicidal thoughts you must seek medical attention immediately by calling 911 or calling your MD immediately  if symptoms less severe.  You Must read complete  instructions/literature along with all the possible adverse reactions/side effects for all the Medicines you take and that have been prescribed to you. Take any new Medicines after you have completely understood and accept all the possible adverse reactions/side effects.   Please note  You were cared for by a hospitalist during your hospital stay. If you have any questions about your discharge medications or the care you received while you were in the hospital after you are discharged, you can call the unit and asked to speak with the hospitalist on call if the hospitalist that took care of you is not available. Once you are discharged, your primary care physician will handle any further medical issues. Please note that NO REFILLS for any discharge medications will be authorized once you are discharged, as it is imperative that you return to your primary care physician (or establish a relationship with a primary care physician if you do not have one) for your aftercare needs so that they can reassess your need for medications and monitor your lab values. Today   SUBJECTIVE   Feels better now--had pain in the nite  VITAL SIGNS:  Blood pressure 107/62, pulse 90, temperature 97.6 F (36.4 C), resp. rate 11, height 6\' 1"  (1.854 m), weight 106 kg, SpO2 100 %.  I/O:    Intake/Output Summary (Last 24 hours) at 05/20/2020 1353 Last data filed at 05/20/2020 1259 Gross per 24 hour  Intake 1694.8 ml  Output 1501 ml  Net 193.8 ml    PHYSICAL EXAMINATION:  GENERAL:  39 y.o.-year-old patient lying in the bed with no acute distress.  EYES: Pupils equal, round, reactive to light and accommodation. No scleral icterus.  HEENT: Head atraumatic, normocephalic. Oropharynx and nasopharynx clear.  NECK:  Supple, no jugular venous distention. No thyroid enlargement, no tenderness.  LUNGS: Normal breath sounds bilaterally, no wheezing, rales,rhonchi or crepitation. No use of accessory muscles of respiration.   CARDIOVASCULAR: S1, S2 normal. No murmurs, rubs, or gallops.  ABDOMEN: Soft, non-tender, non-distended. Bowel sounds present. No organomegaly or mass.  EXTREMITIES: No pedal edema, cyanosis, or clubbing.  NEUROLOGIC: Cranial nerves II through XII are intact. Muscle strength 5/5 in all extremities. Sensation intact. Gait not checked.  PSYCHIATRIC: The patient is alert and oriented x 3.  SKIN: No obvious rash, lesion, or ulcer.   DATA REVIEW:   CBC  Recent Labs  Lab 05/20/20 0612  WBC 10.1  HGB 13.2  HCT 40.3  PLT 164    Chemistries  Recent Labs  Lab 05/17/20 1950 05/19/20 0940 05/20/20 0612  NA 140   < > 139  K 3.6   < > 3.8  CL 106   < > 107  CO2 26   < > 26  GLUCOSE 120*   < > 105*  BUN 13   < > 14  CREATININE 1.35*   < > 2.02*  CALCIUM 9.5   < > 8.6*  MG  --   --  1.7  AST 25  --   --   ALT 32  --   --   ALKPHOS 47  --   --  BILITOT 0.8  --   --    < > = values in this interval not displayed.    Microbiology Results   Recent Results (from the past 240 hour(s))  SARS Coronavirus 2 by RT PCR (hospital order, performed in [facility] hospital lab) Nasopharyngeal Nasopharyngeal Swab     Status: None   Collection Time: 05/19/20 12:54 PM   Specimen: Nasopharyngeal Swab  Result Value Ref Range Status   SARS Coronavirus 2 NEGATIVE NEGATIVE Final    Comment: (NOTE) SARS-CoV-2 target nucleic acids are NOT DETECTED. The SARS-CoV-2 RNA is generally detectable in upper and lower respiratory specimens during the acute phase of infection. The lowest concentration of SARS-CoV-2 viral copies this assay can detect is 250 copies / mL. A negative result does not preclude SARS-CoV-2 infection and should not be used as the sole basis for treatment or other patient management decisions.  A negative result may occur with improper specimen collection / handling, submission of specimen other than nasopharyngeal swab, presence of viral mutation(s) within the areas targeted by  this assay, and inadequate number of viral copies (<250 copies / mL). A negative result must be combined with clinical observations, patient history, and epidemiological information. Fact Sheet for Patients:   StrictlyIdeas.no Fact Sheet for Healthcare Providers: BankingDealers.co.za This test is not yet approved or cleared  by the [place] FDA and has been authorized for detection and/or diagnosis of SARS-CoV-2 by FDA under an Emergency Use Authorization (EUA).  This EUA will remain in effect (meaning this test can be used) for the duration of the COVID-19 declaration under Section 564(b)(1) of the Act, 21 U.S.C. section 360bbb-3(b)(1), unless the authorization is terminated or revoked sooner. Performed at [facility], [address] [place], [place] [address]     RADIOLOGY:  Bea Graff 1 View  Result Date: 05/19/2020 CLINICAL DATA:  39 year old male with left flank pain, obstructing distal left ureteral calculus on CT 2 days ago. EXAM: ABDOMEN - 1 VIEW COMPARISON:  CT Abdomen and Pelvis 05/17/2020. FINDINGS: Supine views of the abdomen and pelvis. There are now 2 calculi visible in the left hemipelvis, 1 of these was a pelvic phlebolith on the recent CT. And I suspect the more medial calculus today is the urinary stone now located at the ureterovesical junction. Negative visible bowel gas pattern. Negative abdominal visceral contours. No acute osseous abnormality identified. IMPRESSION: Suspect interval migration of the distal left ureteral calculus to the ureterovesical junction. Electronically Signed   By: Genevie Ann M.D.   On: 05/19/2020 19:35   DG OR UROLOGY CYSTO IMAGE ([facility] ONLY)  Result Date: 05/20/2020 There is no interpretation for this exam.  This order is for images obtained during a surgical procedure.  Please See "Surgeries" Tab for more information regarding the procedure.     CODE STATUS:     Code Status Orders   (From admission, onward)         Start     Ordered   05/20/20 0504  Full code  Continuous     05/20/20 0504        Code Status History    This patient has a current code status but no historical code status.   Advance Care Planning Activity       TOTAL TIME TAKING CARE OF THIS PATIENT: *40* minutes.    Fritzi Mandes M.D  [facility]    CC: Primary care physician; Zeb Comfort, MD

## 2020-05-20 NOTE — Progress Notes (Signed)
Discussed discharge instruction, including medications and follow appointments..    While there patient passed another stone.  Placed in cup with other stones.

## 2020-05-20 NOTE — Anesthesia Procedure Notes (Signed)
Procedure Name: LMA Insertion Date/Time: 05/20/2020 11:55 AM Performed by: Almeta Monas, CRNA Pre-anesthesia Checklist: Patient identified, Patient being monitored, Timeout performed, Emergency Drugs available and Suction available Patient Re-evaluated:Patient Re-evaluated prior to induction Oxygen Delivery Method: Circle system utilized Preoxygenation: Pre-oxygenation with 100% oxygen Induction Type: IV induction Ventilation: Mask ventilation without difficulty LMA: LMA inserted LMA Size: 4.5 Tube type: Oral Number of attempts: 1 Placement Confirmation: positive ETCO2 and breath sounds checked- equal and bilateral Tube secured with: Tape Dental Injury: Teeth and Oropharynx as per pre-operative assessment

## 2020-05-20 NOTE — Addendum Note (Signed)
Addendum  created 05/20/20 1530 by Karleen Hampshire, MD   Clinical Note Signed

## 2020-05-20 NOTE — Anesthesia Preprocedure Evaluation (Addendum)
Anesthesia Evaluation  Patient identified by MRN, date of birth, ID band Patient awake    Reviewed: Allergy & Precautions, H&P , NPO status , Patient's Chart, lab work & pertinent test results  History of Anesthesia Complications (+) PONV  Airway Mallampati: II  TM Distance: >3 FB Neck ROM: full    Dental  (+) Teeth Intact   Pulmonary neg pulmonary ROS, Not current smoker,    breath sounds clear to auscultation       Cardiovascular (-) hypertension(-) anginanegative cardio ROS   Rhythm:regular Rate:Normal     Neuro/Psych negative neurological ROS  negative psych ROS   GI/Hepatic negative GI ROS, Neg liver ROS,   Endo/Other  Hypothyroidism   Renal/GU Renal disease (AKI from obstructing stone)     Musculoskeletal   Abdominal   Peds  Hematology negative hematology ROS (+)   Anesthesia Other Findings Past Medical History: No date: Gout No date: Hypothyroidism  Past Surgical History: No date: HERNIA REPAIR No date: TONSILLECTOMY  BMI    Body Mass Index: 30.83 kg/m      Reproductive/Obstetrics negative OB ROS                          Anesthesia Physical Anesthesia Plan  ASA: II  Anesthesia Plan: General ETT and Modified Rapid Sequence   Post-op Pain Management:    Induction:   PONV Risk Score and Plan: Ondansetron, Dexamethasone, Midazolam and Treatment may vary due to age or medical condition  Airway Management Planned:   Additional Equipment:   Intra-op Plan:   Post-operative Plan:   Informed Consent: I have reviewed the patients History and Physical, chart, labs and discussed the procedure including the risks, benefits and alternatives for the proposed anesthesia with the patient or authorized representative who has indicated his/her understanding and acceptance.     Dental Advisory Given  Plan Discussed with: Anesthesiologist, CRNA and Surgeon  Anesthesia Plan  Comments:         Anesthesia Quick Evaluation

## 2020-05-20 NOTE — Transfer of Care (Signed)
Immediate Anesthesia Transfer of Care Note  Patient: Pedro Elliott  Procedure(s) Performed: CYSTOSCOPY/RETROGRADE/URETEROSCOPY (Left Ureter) CYSTOSCOPY WITH STENT PLACEMENT (Left Ureter)  Patient Location: PACU  Anesthesia Type:General  Level of Consciousness: sedated  Airway & Oxygen Therapy: Patient Spontanous Breathing and Patient connected to face mask oxygen  Post-op Assessment: Report given to RN and Post -op Vital signs reviewed and stable  Post vital signs: Reviewed and stable  Last Vitals:  Vitals Value Taken Time  BP 92/57 05/20/20 1255  Temp 36.4 C 05/20/20 1255  Pulse 79 05/20/20 1258  Resp 11 05/20/20 1258  SpO2 100 % 05/20/20 1258  Vitals shown include unvalidated device data.  Last Pain:  Vitals:   05/20/20 1255  TempSrc:   PainSc: Asleep         Complications: No apparent anesthesia complications

## 2020-05-22 ENCOUNTER — Telehealth: Payer: Self-pay | Admitting: Urology

## 2020-05-22 ENCOUNTER — Other Ambulatory Visit: Payer: Self-pay

## 2020-05-22 DIAGNOSIS — N1330 Unspecified hydronephrosis: Secondary | ICD-10-CM

## 2020-05-22 NOTE — Telephone Encounter (Signed)
App made and mailed to patient ° °Pedro Elliott °

## 2020-05-22 NOTE — Telephone Encounter (Signed)
-----   Message from Jerilee Field, MD sent at 05/20/2020 12:52 PM EDT ----- Needs to see me in about 6 weeks with a renal US prior - thanks!

## 2020-05-23 ENCOUNTER — Ambulatory Visit (INDEPENDENT_AMBULATORY_CARE_PROVIDER_SITE_OTHER): Payer: Self-pay | Admitting: Physician Assistant

## 2020-05-23 ENCOUNTER — Encounter: Payer: Self-pay | Admitting: Emergency Medicine

## 2020-05-23 ENCOUNTER — Other Ambulatory Visit: Payer: Self-pay

## 2020-05-23 ENCOUNTER — Emergency Department
Admission: EM | Admit: 2020-05-23 | Discharge: 2020-05-23 | Disposition: A | Discharge status: Home / Self Care | Payer: Medicaid Other | Attending: Emergency Medicine | Admitting: Emergency Medicine

## 2020-05-23 ENCOUNTER — Encounter: Payer: Self-pay | Admitting: Physician Assistant

## 2020-05-23 VITALS — BP 132/76 | HR 76 | Ht 73.0 in | Wt 233.0 lb

## 2020-05-23 DIAGNOSIS — E039 Hypothyroidism, unspecified: Secondary | ICD-10-CM | POA: Insufficient documentation

## 2020-05-23 DIAGNOSIS — N23 Unspecified renal colic: Secondary | ICD-10-CM | POA: Insufficient documentation

## 2020-05-23 DIAGNOSIS — R109 Unspecified abdominal pain: Secondary | ICD-10-CM

## 2020-05-23 DIAGNOSIS — Z79899 Other long term (current) drug therapy: Secondary | ICD-10-CM | POA: Insufficient documentation

## 2020-05-23 DIAGNOSIS — N182 Chronic kidney disease, stage 2 (mild): Secondary | ICD-10-CM | POA: Insufficient documentation

## 2020-05-23 DIAGNOSIS — N1330 Unspecified hydronephrosis: Secondary | ICD-10-CM

## 2020-05-23 LAB — BLADDER SCAN AMB NON-IMAGING: Scan Result: 14

## 2020-05-23 LAB — URINALYSIS, COMPLETE
Bilirubin, UA: NEGATIVE
Glucose, UA: NEGATIVE
Ketones, UA: NEGATIVE
Leukocytes,UA: NEGATIVE
Nitrite, UA: NEGATIVE
Specific Gravity, UA: 1.025 (ref 1.005–1.030)
Urobilinogen, Ur: 0.2 mg/dL (ref 0.2–1.0)
pH, UA: 7.5 (ref 5.0–7.5)

## 2020-05-23 LAB — MICROSCOPIC EXAMINATION
Bacteria, UA: NONE SEEN
RBC, Urine: >30 /hpf — AB (ref 0–2)

## 2020-05-23 LAB — BASIC METABOLIC PANEL
Anion gap: 9 (ref 5–15)
BUN: 19 mg/dL (ref 6–20)
CO2: 26 mmol/L (ref 22–32)
Calcium: 9.6 mg/dL (ref 8.9–10.3)
Chloride: 106 mmol/L (ref 98–111)
Creatinine, Ser: 1.29 mg/dL — ABNORMAL HIGH (ref 0.61–1.24)
GFR calc Af Amer: >60 mL/min (ref >60)
GFR calc non Af Amer: >60 mL/min (ref >60)
Glucose, Bld: 104 mg/dL — ABNORMAL HIGH (ref 70–99)
Potassium: 3.5 mmol/L (ref 3.5–5.1)
Sodium: 141 mmol/L (ref 135–145)

## 2020-05-23 LAB — CBC
HCT: 38.8 % — ABNORMAL LOW (ref 39.0–52.0)
Hemoglobin: 13.2 g/dL (ref 13.0–17.0)
MCH: 30.8 pg (ref 26.0–34.0)
MCHC: 34 g/dL (ref 30.0–36.0)
MCV: 90.7 fL (ref 80.0–100.0)
Platelets: 198 10*3/uL (ref 150–400)
RBC: 4.28 MIL/uL (ref 4.22–5.81)
RDW: 12.6 % (ref 11.5–15.5)
WBC: 10.6 10*3/uL — ABNORMAL HIGH (ref 4.0–10.5)
nRBC: 0 % (ref 0.0–0.2)

## 2020-05-23 LAB — URINALYSIS, COMPLETE (UACMP) WITH MICROSCOPIC
Bacteria, UA: NONE SEEN
Bilirubin Urine: NEGATIVE
Glucose, UA: NEGATIVE mg/dL
Ketones, ur: NEGATIVE mg/dL
Leukocytes,Ua: NEGATIVE
Nitrite: NEGATIVE
Protein, ur: 30 mg/dL — AB
Specific Gravity, Urine: 1.008 (ref 1.005–1.030)
Squamous Epithelial / HPF: NONE SEEN (ref 0–5)
pH: 8 (ref 5.0–8.0)

## 2020-05-23 MED ORDER — KETOROLAC TROMETHAMINE 60 MG/2ML IM SOLN
60.0000 mg | Freq: Once | INTRAMUSCULAR | Status: AC
Start: 2020-05-23 — End: 2020-05-23
  Administered 2020-05-23: 60 mg via INTRAMUSCULAR

## 2020-05-23 MED ORDER — HYDROMORPHONE HCL 1 MG/ML IJ SOLN
INTRAMUSCULAR | Status: AC
  Administered 2020-05-23: 1 mg via INTRAVENOUS
  Filled 2020-05-23: qty 1

## 2020-05-23 MED ORDER — ONDANSETRON HCL 4 MG/2ML IJ SOLN
INTRAMUSCULAR | Status: AC
  Administered 2020-05-23: 4 mg via INTRAVENOUS
  Filled 2020-05-23: qty 2

## 2020-05-23 MED ORDER — LACTATED RINGERS IV BOLUS
1000.0000 mL | Freq: Once | INTRAVENOUS | Status: AC
  Administered 2020-05-23: 1000 mL via INTRAVENOUS

## 2020-05-23 MED ORDER — ONDANSETRON HCL 4 MG/2ML IJ SOLN: 4.0000 mg | Freq: Once | INTRAMUSCULAR | Status: AC

## 2020-05-23 MED ORDER — HYDROMORPHONE HCL 1 MG/ML IJ SOLN: 1.0000 mg | Freq: Once | INTRAMUSCULAR | Status: AC

## 2020-05-23 NOTE — ED Triage Notes (Signed)
Pt arrived via POV with c/o left flank pain after removing stent placed on Saturday, pt states after removing the stent he started having severe pain, sent her by urology.  Pt recently admitted and had stent placed on Saturday during hospital admission.  Pt reports they are concerned for hydronephrosis.

## 2020-05-23 NOTE — Discharge Instructions (Addendum)
Continue your Ibuprofen 800 mg every 8 hours  Drink plenty of fluid  Take the pain meds as needed

## 2020-05-23 NOTE — ED Provider Notes (Signed)
g  [facility] Emergency Department Provider Note  ____________________________________________   First MD Initiated Contact with Patient 05/23/20 1106     (approximate)  I have reviewed the triage vital signs and the nursing notes.   HISTORY  Chief Complaint Flank Pain    HPI Pedro Elliott is a 39 y.o. male with history of recent left ureteral stent placement here with severe left flank pain, nausea, and pain.  The patient states that he had generally been recovering well.  He pulled the stent as he was instructed to do at home today.  This had significant stinging pain during the stent removal.  Initially felt okay, but then developed progressively worsening severe, aching, throbbing, cramping, left flank pain.  Associated nausea and sensation like he wanted to vomit.  The pain feels similar to his kidney stone.  He went to urology and was sent here for further evaluation.  He was given a shot of Toradol.  He states the Toradol is significantly improving his pain but it still persists.  No fevers or chills.  He had no nausea or vomiting, fever, chills, or other symptoms prior to the removal of the stent.  Denies any overt dysuria, only mildly cloudy urine which is persisted since the stone.  This is been clearing.       Past Medical History:  Diagnosis Date  . Gout   . Hypothyroidism     Patient Active Problem List   Diagnosis Date Noted  . Gout   . Hydronephrosis of left kidney   . Left ureteral stone   . CKD (chronic kidney disease), stage II   . Hypokalemia   . Hypothyroidism     Past Surgical History:  Procedure Laterality Date  . CYSTOSCOPY WITH STENT PLACEMENT Left 05/20/2020   Procedure: CYSTOSCOPY WITH STENT PLACEMENT;  Surgeon: Jerilee Field, MD;  Location: [facility] ORS;  Service: Urology;  Laterality: Left;  . CYSTOSCOPY/RETROGRADE/URETEROSCOPY Left 05/20/2020   Procedure: CYSTOSCOPY/RETROGRADE/URETEROSCOPY;  Surgeon: Jerilee Field, MD;   Location: [facility] ORS;  Service: Urology;  Laterality: Left;  . HERNIA REPAIR    . TONSILLECTOMY      Prior to Admission medications   Medication Sig Start Date End Date Taking? Authorizing Provider  allopurinol (ZYLOPRIM) 100 MG tablet Take 200 mg by mouth daily. 02/10/20   [provider]  colchicine 0.6 MG tablet Take 2 tablets (1.2 mg total) by mouth once for 1 dose. If pain persists after 1 hour, take 1 additional pill. No more than 3 pills in 24 hours. 05/03/18 05/19/20  Triplett, Rulon Eisenmenger B, FNP  ibuprofen (ADVIL) 800 MG tablet Take 800 mg by mouth every 8 (eight) hours as needed.    [provider]  levothyroxine (SYNTHROID) 50 MCG tablet Take 50 mcg by mouth daily. 01/06/20   [provider]  Multiple Vitamin (MULTI-VITAMIN) tablet Take 1 tablet by mouth daily.    [provider]  ondansetron (ZOFRAN ODT) 4 MG disintegrating tablet Take 1 tablet (4 mg total) by mouth every 8 (eight) hours as needed for nausea or vomiting. 05/17/20   Chesley Noon, MD  oxyCODONE-acetaminophen (PERCOCET) 5-325 MG tablet Take 1 tablet by mouth every 4 (four) hours as needed for severe pain. 05/20/20 05/20/21  Enedina Finner, MD  tamsulosin [facility]) 0.4 MG CAPS capsule Take 1 capsule (0.4 mg total) by mouth daily after breakfast. 05/17/20   Chesley Noon, MD    Allergies Other  Family History  Problem Relation Age of Onset  . Kidney  Stones Mother   . Diabetes Mellitus II Mother   . Kidney Stones Father     Social History Social History   Tobacco Use  . Smoking status: Never Smoker  . Smokeless tobacco: Never Used  Substance Use Topics  . Alcohol use: Not on file  . Drug use: Not on file    Review of Systems  Review of Systems  Constitutional: Negative for chills, fatigue and fever.  HENT: Negative for sore throat.   Respiratory: Negative for shortness of breath.   Cardiovascular: Negative for chest pain.  Gastrointestinal: Negative for abdominal pain.    Genitourinary: Positive for flank pain and hematuria.  Musculoskeletal: Negative for neck pain.  Skin: Negative for rash and wound.  Allergic/Immunologic: Negative for immunocompromised state.  Neurological: Negative for weakness and numbness.  Hematological: Does not bruise/bleed easily.  All other systems reviewed and are negative.    ____________________________________________  PHYSICAL EXAM:      VITAL SIGNS: ED Triage Vitals  Enc Vitals Group     BP 05/23/20 1030 133/71     Pulse Rate 05/23/20 1030 88     Resp 05/23/20 1030 18     Temp 05/23/20 1030 98.6 F (37 C)     Temp Source 05/23/20 1030 Oral     SpO2 05/23/20 1030 98 %     Weight 05/23/20 1035 233 lb (105.7 kg)     Height 05/23/20 1035 6\' 1"  (1.854 m)     Head Circumference --      Peak Flow --      Pain Score 05/23/20 1034 6     Pain Loc --      Pain Edu? --      Excl. in [place]? --      Physical Exam Vitals and nursing note reviewed.  Constitutional:      General: He is not in acute distress.    Appearance: He is well-developed.  HENT:     Head: Normocephalic and atraumatic.  Eyes:     Conjunctiva/sclera: Conjunctivae normal.  Cardiovascular:     Rate and Rhythm: Normal rate and regular rhythm.     Heart sounds: Normal heart sounds. No murmur. No friction rub.  Pulmonary:     Effort: Pulmonary effort is normal. No respiratory distress.     Breath sounds: Normal breath sounds. No wheezing or rales.  Abdominal:     General: There is no distension.     Palpations: Abdomen is soft.     Tenderness: There is no abdominal tenderness.  Musculoskeletal:     Cervical back: Neck supple.  Skin:    General: Skin is warm.     Capillary Refill: Capillary refill takes less than 2 seconds.  Neurological:     Mental Status: He is alert and oriented to person, place, and time.     Motor: No abnormal muscle tone.       ____________________________________________   LABS (all labs ordered are listed, but  only abnormal results are displayed)  Labs Reviewed  URINALYSIS, COMPLETE (UACMP) WITH MICROSCOPIC - Abnormal; Notable for the following components:      Result Value   Color, Urine STRAW (*)    APPearance CLEAR (*)    Hgb urine dipstick MODERATE (*)    Protein, ur 30 (*)    All other components within normal limits  BASIC METABOLIC PANEL - Abnormal; Notable for the following components:   Glucose, Bld 104 (*)    Creatinine, Ser 1.29 (*)  All other components within normal limits  CBC - Abnormal; Notable for the following components:   WBC 10.6 (*)    HCT 38.8 (*)    All other components within normal limits  URINE CULTURE    ____________________________________________  EKG: None ________________________________________  RADIOLOGY All imaging, including plain films, CT scans, and ultrasounds, independently reviewed by me, and interpretations confirmed via formal radiology reads.  ED MD interpretation:   None  Official radiology report(s): No results found.  ____________________________________________  PROCEDURES   Procedure(s) performed (including Critical Care):  Procedures  ____________________________________________  INITIAL IMPRESSION / MDM / ASSESSMENT AND PLAN / ED COURSE  As part of my medical decision making, I reviewed the following data within the electronic MEDICAL RECORD NUMBER Nursing notes reviewed and incorporated, Old chart reviewed, Notes from prior ED visits, and [place] Controlled Substance Database       *Ankith Edmonston was evaluated in Emergency Department on 05/23/2020 for the symptoms described in the history of present illness. He was evaluated in the context of the global COVID-19 pandemic, which necessitated consideration that the patient might be at risk for infection with the SARS-CoV-2 virus that causes COVID-19. Institutional protocols and algorithms that pertain to the evaluation of patients at risk for COVID-19 are in a state of rapid change  based on information released by regulatory bodies including the CDC and federal and state organizations. These policies and algorithms were followed during the patient's care in the ED.  Some ED evaluations and interventions may be delayed as a result of limited staffing during the pandemic.*     Medical Decision Making: 39 year old male here with severe left flank pain after stent removal.  Patient appears in moderate distress on arrival but has had excellent response to Toradol and analgesia.  Lab work shows minimal leukocytosis.  He has no fever, was well prior to the ureteric stent removal, with no nausea or vomiting or chills, and I do not suspect infected stent.  Urinalysis shows hematuria without pyuria or bacteriuria.  His renal function is at baseline.  Discussed case with urology Dr. Richardo Hanks. Given his well appearance, pain control, reassuring labs and history, will hold on imaging and treat symptomatically with NSAIDs, fluids, and goof return precautions.  ____________________________________________  FINAL CLINICAL IMPRESSION(S) / ED DIAGNOSES  Final diagnoses:  Flank pain  Renal colic     MEDICATIONS GIVEN DURING THIS VISIT:  Medications  lactated ringers bolus 1,000 mL (0 mLs Intravenous Stopped 05/23/20 1336)  HYDROmorphone (DILAUDID) injection 1 mg (1 mg Intravenous Given 05/23/20 1133)  ondansetron (ZOFRAN) injection 4 mg (4 mg Intravenous Given 05/23/20 1133)     ED Discharge Orders    None       Note:  This document was prepared using Dragon voice recognition software and may include unintentional dictation errors.   Shaune Pollack, MD 05/23/20 [phone]

## 2020-05-23 NOTE — Progress Notes (Signed)
Patient presented to clinic today with reports of severe flank pain following at home stent removal this morning.  UA notable for microscopic hematuria, reassuring for UTI.  PVR 14 mL.  He was administered 1 dose of Toradol 60 mg IM in clinic.  Due to continued severe pain, he was referred to the emergency room for further evaluation.

## 2020-05-23 NOTE — ED Notes (Signed)
Pt had hit call bell needing to urinate. Handed urinal since pt had received pain medication.

## 2020-05-23 NOTE — ED Triage Notes (Signed)
Pt sent from Urology office. Pt had stent removed and is in severe pain and there is concern for kidney edema. Pt received toradol in the clinic

## 2020-05-24 LAB — URINE CULTURE: Culture: NO GROWTH

## 2020-06-22 ENCOUNTER — Ambulatory Visit: Payer: Self-pay

## 2020-06-23 ENCOUNTER — Other Ambulatory Visit: Payer: Self-pay

## 2020-06-23 ENCOUNTER — Ambulatory Visit
Admission: RE | Admit: 2020-06-23 | Discharge: 2020-06-23 | Disposition: A | Discharge status: Home / Self Care | Payer: BC Managed Care – PPO | Source: Ambulatory Visit | Attending: Urology | Admitting: Urology

## 2020-06-23 DIAGNOSIS — N1330 Unspecified hydronephrosis: Secondary | ICD-10-CM | POA: Insufficient documentation

## 2020-07-07 ENCOUNTER — Ambulatory Visit: Payer: Self-pay | Admitting: Urology

## 2020-07-21 ENCOUNTER — Ambulatory Visit: Payer: Self-pay | Admitting: Urology

## 2020-12-08 ENCOUNTER — Encounter: Payer: Self-pay | Admitting: Urology

## 2020-12-08 ENCOUNTER — Other Ambulatory Visit: Payer: Self-pay

## 2020-12-08 ENCOUNTER — Ambulatory Visit (INDEPENDENT_AMBULATORY_CARE_PROVIDER_SITE_OTHER): Payer: BC Managed Care – PPO | Admitting: Urology

## 2020-12-08 VITALS — BP 125/84 | HR 112 | Ht 73.0 in | Wt 240.0 lb

## 2020-12-08 DIAGNOSIS — E559 Vitamin D deficiency, unspecified: Secondary | ICD-10-CM | POA: Insufficient documentation

## 2020-12-08 DIAGNOSIS — N201 Calculus of ureter: Secondary | ICD-10-CM

## 2020-12-08 DIAGNOSIS — N281 Cyst of kidney, acquired: Secondary | ICD-10-CM

## 2020-12-08 NOTE — Patient Instructions (Signed)

## 2020-12-08 NOTE — Progress Notes (Signed)
12/08/2020 2:43 PM   Pedro Elliott. [birth year] [id number]  Referring provider: [place] [address] [place],  [place] [address]  Chief Complaint  Patient presents with  . Nephrolithiasis    HPI:  Pedro Elliott follows up for kidney stones.  He underwent cystoscopy with left ureteroscopy laser lithotripsy and a stent placement for left distal stone in May 2021. CT with no other stones but a 2.6 cm LUP cyst. He remove the stent on his own.  Follow-up ultrasound June 2021 was benign without stone or hydronephrosis and again showed the left cyst.   He returns and has some stone fragments for analysis. He was also concerned about his "chronic kidney disease"  and hypokalemia.    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   PMH: Past Medical History:  Diagnosis Date  . Gout   . Hypothyroidism     Surgical History: Past Surgical History:  Procedure Laterality Date  . CYSTOSCOPY WITH STENT PLACEMENT Left 05/20/2020   Procedure: CYSTOSCOPY WITH STENT PLACEMENT;  Surgeon: Jerilee Field, MD;  Location: [facility] ORS;  Service: Urology;  Laterality: Left;  . CYSTOSCOPY/RETROGRADE/URETEROSCOPY Left 05/20/2020   Procedure: CYSTOSCOPY/RETROGRADE/URETEROSCOPY;  Surgeon: Jerilee Field, MD;  Location: [facility] ORS;  Service: Urology;  Laterality: Left;  . HERNIA REPAIR    . TONSILLECTOMY      Home Medications:  Allergies as of 12/08/2020      Reactions   Other Anaphylaxis   cats      Medication List       Accurate as of December 08, 2020  2:43 PM. If you have any questions, ask your nurse or doctor.        STOP taking these medications   ondansetron 4 MG disintegrating tablet Commonly known as: Zofran ODT Stopped by: Jerilee Field, MD   oxyCODONE-acetaminophen 5-325 MG tablet Commonly known as: Percocet Stopped by: Jerilee Field, MD   tamsulosin 0.4 MG Caps capsule Commonly known as: FLOMAX Stopped by: Jerilee Field, MD     TAKE these medications    allopurinol 100 MG tablet Commonly known as: ZYLOPRIM Take 200 mg by mouth daily.   colchicine 0.6 MG tablet Take 2 tablets (1.2 mg total) by mouth once for 1 dose. If pain persists after 1 hour, take 1 additional pill. No more than 3 pills in 24 hours.   ibuprofen 800 MG tablet Commonly known as: ADVIL Take 800 mg by mouth every 8 (eight) hours as needed.   levothyroxine 50 MCG tablet Commonly known as: SYNTHROID Take 50 mcg by mouth daily.   Multi-Vitamin tablet Take 1 tablet by mouth daily.       Allergies:  Allergies  Allergen Reactions  . Other Anaphylaxis    cats    Family History: Family History  Problem Relation Age of Onset  . Kidney Stones Mother   . Diabetes Mellitus II Mother   . Kidney Stones Father     Social History:  reports that he has never smoked. He has never used smokeless tobacco. He reports previous alcohol use. He reports that he does not use drugs.   Physical Exam: BP 125/84 (BP Location: Left Arm, Patient Position: Sitting, Cuff Size: Large)   Pulse (!) 112   Ht 6\' 1"  (1.854 m)   Wt 240 lb (108.9 kg)   BMI 31.66 kg/m   Constitutional:  Alert and oriented, No acute distress. HEENT: [place] AT, moist mucus membranes.  Trachea midline, no masses. Cardiovascular: No clubbing, cyanosis, or edema.  Respiratory: Normal respiratory effort, no increased work of breathing. GI: Abdomen is soft, nontender, nondistended, no abdominal masses GU: No CVA tenderness Skin: No rashes, bruises or suspicious lesions. Neurologic: Grossly intact, no focal deficits, moving all 4 extremities. Psychiatric: Normal mood and affect.  Laboratory Data: Lab Results  Component Value Date   WBC 10.6 (H) 05/23/2020   HGB 13.2 05/23/2020   HCT 38.8 (L) 05/23/2020   MCV 90.7 05/23/2020   PLT 198 05/23/2020    Lab Results  Component Value Date   CREATININE 1.29 (H) 05/23/2020    No results found for: PSA  No results found for: TESTOSTERONE  No results found  for: HGBA1C  Urinalysis    Component Value Date/Time   COLORURINE STRAW (A) 05/23/2020 1104   APPEARANCEUR CLEAR (A) 05/23/2020 1104   APPEARANCEUR Cloudy (A) 05/23/2020 0854   LABSPEC 1.008 05/23/2020 1104   PHURINE 8.0 05/23/2020 1104   GLUCOSEU NEGATIVE 05/23/2020 1104   HGBUR MODERATE (A) 05/23/2020 1104   BILIRUBINUR NEGATIVE 05/23/2020 1104   BILIRUBINUR Negative 05/23/2020 0854   KETONESUR NEGATIVE 05/23/2020 1104   PROTEINUR 30 (A) 05/23/2020 1104   PROTEINUR 1+ (A) 05/23/2020 0854   NITRITE NEGATIVE 05/23/2020 1104   NITRITE Negative 05/23/2020 0854   LEUKOCYTESUR NEGATIVE 05/23/2020 1104   LEUKOCYTESUR Negative 05/23/2020 0854    Lab Results  Component Value Date   LABMICR See below: 05/23/2020   WBCUA 0-5 05/23/2020   LABEPIT 0-10 05/23/2020   BACTERIA NONE SEEN 05/23/2020    Pertinent Imaging: CT 05/21, [place] 06/21 images  Results for orders placed during the hospital encounter of 05/19/20  DG Abd 1 View  Narrative CLINICAL DATA:  39 year old male with left flank pain, obstructing distal left ureteral calculus on CT 2 days ago.  EXAM: ABDOMEN - 1 VIEW  COMPARISON:  CT Abdomen and Pelvis 05/17/2020.  FINDINGS: Supine views of the abdomen and pelvis. There are now 2 calculi visible in the left hemipelvis, 1 of these was a pelvic phlebolith on the recent CT. And I suspect the more medial calculus today is the urinary stone now located at the ureterovesical junction.  Negative visible bowel gas pattern. Negative abdominal visceral contours. No acute osseous abnormality identified.  IMPRESSION: Suspect interval migration of the distal left ureteral calculus to the ureterovesical junction.   Electronically Signed By: Odessa Fleming M.D. On: 05/19/2020 19:35  No results found for this or any previous visit.  No results found for this or any previous visit.  No results found for this or any previous visit.  Results for orders placed during the hospital  encounter of 06/23/20  Ultrasound renal complete  Narrative CLINICAL DATA:  Kidney stones.  EXAM: RENAL / URINARY TRACT ULTRASOUND COMPLETE  COMPARISON:  02/03/2019  FINDINGS: Right Kidney:  Renal measurements: 12.1 x 4.9 x 4.5 cm = volume: 140 mL . Echogenicity within normal limits. No mass or hydronephrosis visualized.  Left Kidney:  Renal measurements: 12.6 x 5.8 x 5.6 cm = volume: 214 mL. There is a 2.7 cm cyst in the upper pole. There is no hydronephrosis.  Bladder:  Appears normal for degree of bladder distention.  Other:  None.  IMPRESSION: No hydronephrosis. No evidence for an echogenic shadowing kidney stone.   Electronically Signed By: Katherine Mantle M.D. On: 06/24/2020 20:13  No results found for this or any previous visit.  No results found for this or any previous visit.  Results for orders placed during the hospital encounter of 05/17/20  CT Renal Stone Study  Narrative CLINICAL DATA:  Left flank and groin pain  EXAM: CT ABDOMEN AND PELVIS WITHOUT CONTRAST  TECHNIQUE: Multidetector CT imaging of the abdomen and pelvis was performed following the standard protocol without IV contrast.  COMPARISON:  Ultrasound 10/29/2018  FINDINGS: Lower chest: No acute abnormality.  Hepatobiliary: 1 cm hypodense lesion in the posterior right hepatic lobe likely a cyst. No gallstones, gallbladder wall thickening, or biliary dilatation.  Pancreas: Unremarkable. No pancreatic ductal dilatation or surrounding inflammatory changes.  Spleen: Normal in size without focal abnormality.  Adrenals/Urinary Tract: Adrenal glands are normal. Hypodense lesion mid pole left kidney, likely a cyst. Mild left hydronephrosis and hydroureter, secondary to a 5 mm stone in the distal left ureter at approximate S3 level. Bladder is otherwise unremarkable  Stomach/Bowel: Stomach is within normal limits. Appendix appears normal. No evidence of bowel wall  thickening, distention, or inflammatory changes.  Vascular/Lymphatic: No significant vascular findings are present. No enlarged abdominal or pelvic lymph nodes.  Reproductive: Prostate is unremarkable.  Other: No abdominal wall hernia or abnormality. No abdominopelvic ascites.  Musculoskeletal: No acute or significant osseous findings.  IMPRESSION: 1. Mild left hydronephrosis and hydroureter, secondary to a 5 mm distal left ureteral stone at the mid sacral level. 2. Hypodense lesions in the liver and left kidney, likely cysts   Electronically Signed By: Jasmine Pang M.D. On: 05/17/2020 20:10   Assessment & Plan:    Renal cyst - given his renal fxn issues, we will check a renal US in 6 mo . Disc importance of f/u with PCP for yearly BP, BS and chol screening.   Ureteral stone - stone sent for analysis. There were no other stones. Disc diet changes to prevent stones.   No follow-ups on file.  Jerilee Field, MD  [facility] [address] [place], [place] [address] [phone]

## 2020-12-14 LAB — CALCULI, WITH PHOTOGRAPH (CLINICAL LAB)
Calcium Oxalate Dihydrate: 10 %
Calcium Oxalate Monohydrate: 90 %
Weight Calculi: 40 mg

## 2021-04-13 IMAGING — CT CT RENAL STONE PROTOCOL
2 of 4 series · 16 of 46 positions shown, 18 images · non-contrast
Comparison: Ultrasound 10/29/2018

CLINICAL DATA: Left flank and groin pain

EXAM:
CT ABDOMEN AND PELVIS WITHOUT CONTRAST
TECHNIQUE: Multidetector CT imaging of the abdomen and pelvis was performed
following the standard protocol without IV contrast.

[Series 4: stone full standard · axial · 0.80mm/px · z∈[-1046,-520]mm · 13 of 115 slices shown, 15 images]
[im 5/115  soft-tissue]
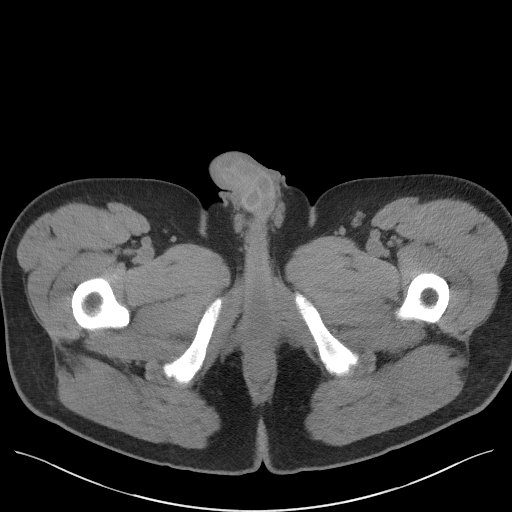
[im 5/115  bone]
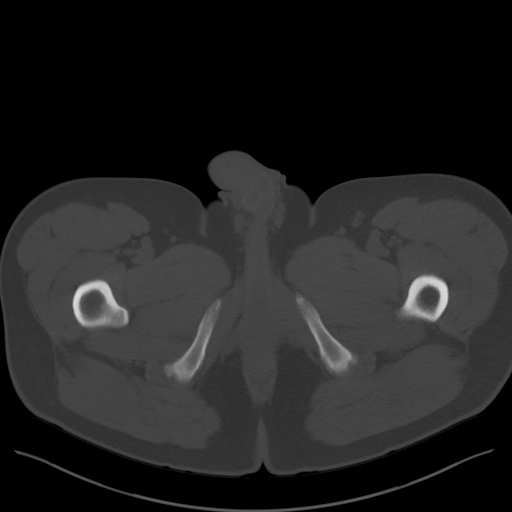
[im 14/115  soft-tissue]
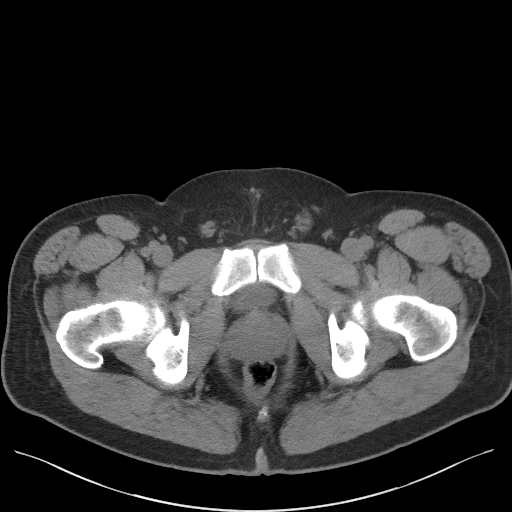
[im 23/115  soft-tissue]
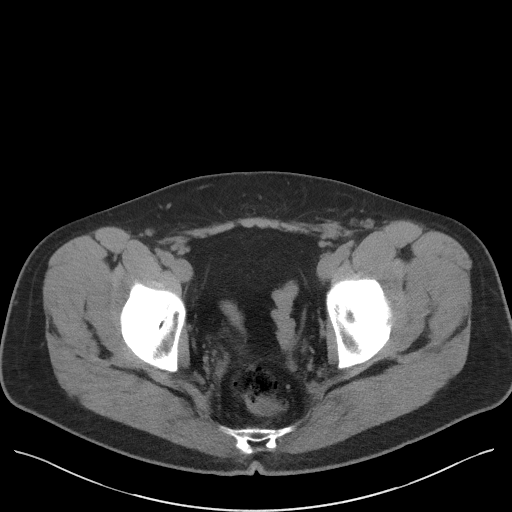
[im 32/115  soft-tissue]
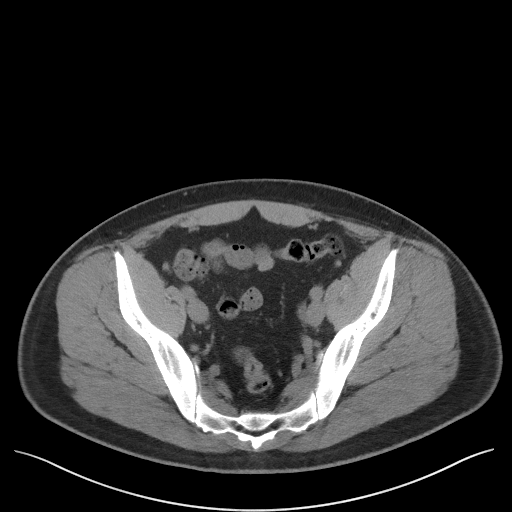
[im 42/115  soft-tissue]
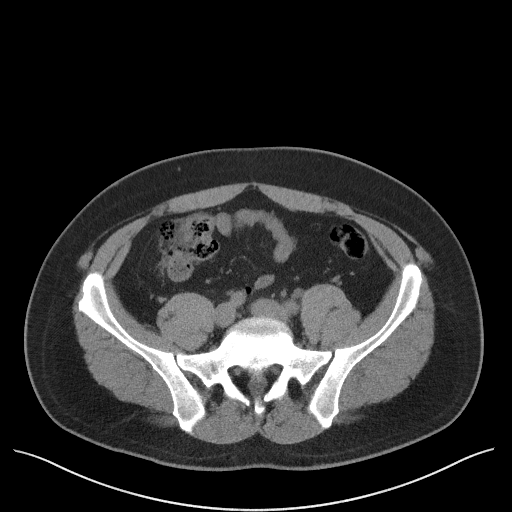
[im 51/115  soft-tissue]
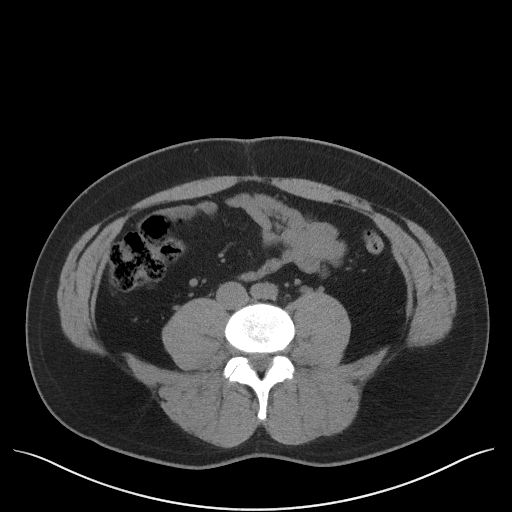
[im 60/115  soft-tissue]
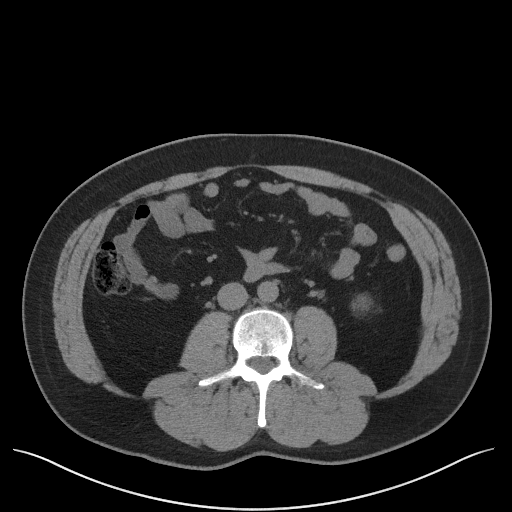
[im 64/115  soft-tissue]
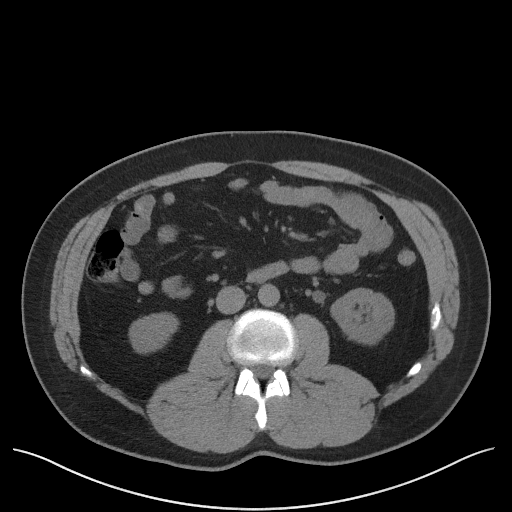
[im 73/115  soft-tissue]
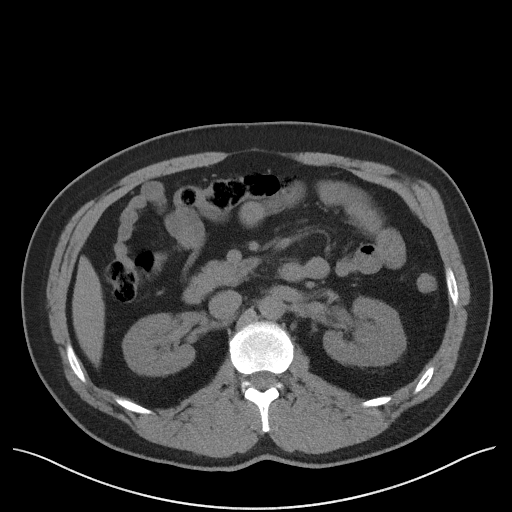
[im 73/115  bone]
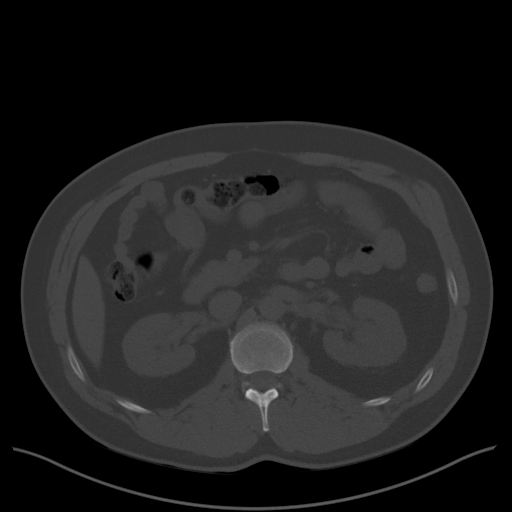
[im 83/115  soft-tissue]
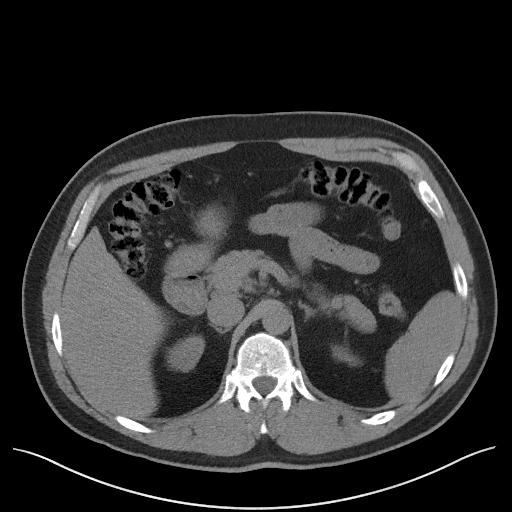
[im 92/115  soft-tissue]
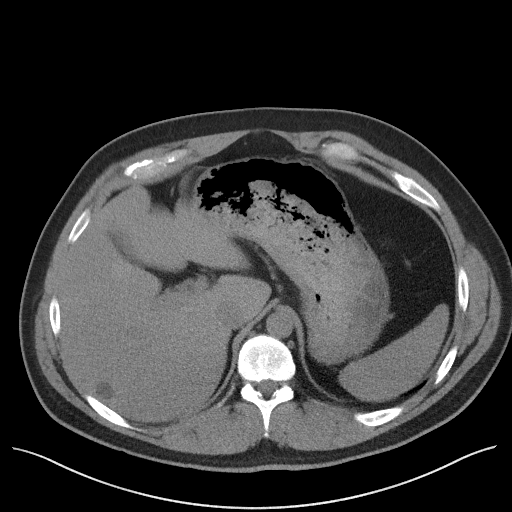
[im 101/115  soft-tissue]
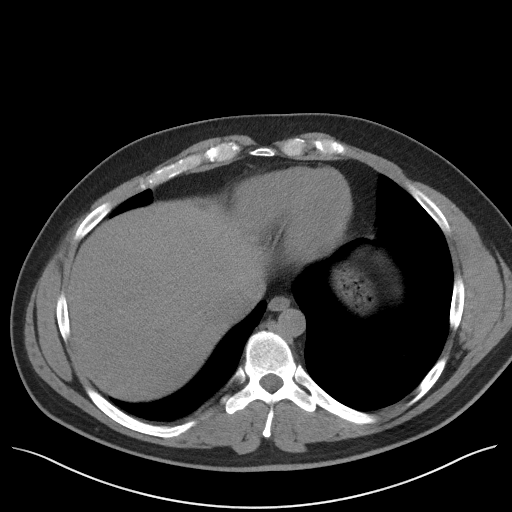
[im 110/115  soft-tissue]
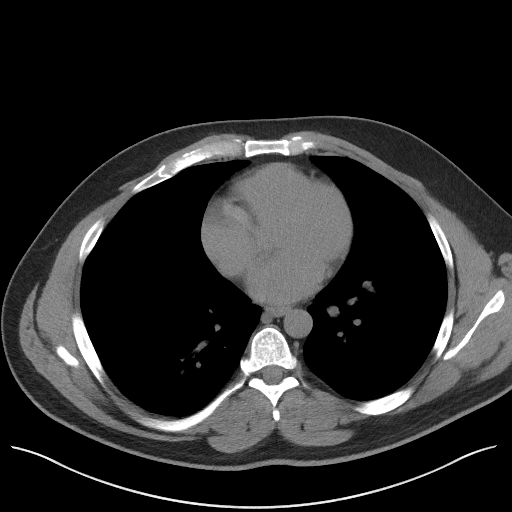

[Series 5: coronal · coronal · 0.80mm/px · 3 of 137 slices shown]
[im 46/137  soft-tissue]
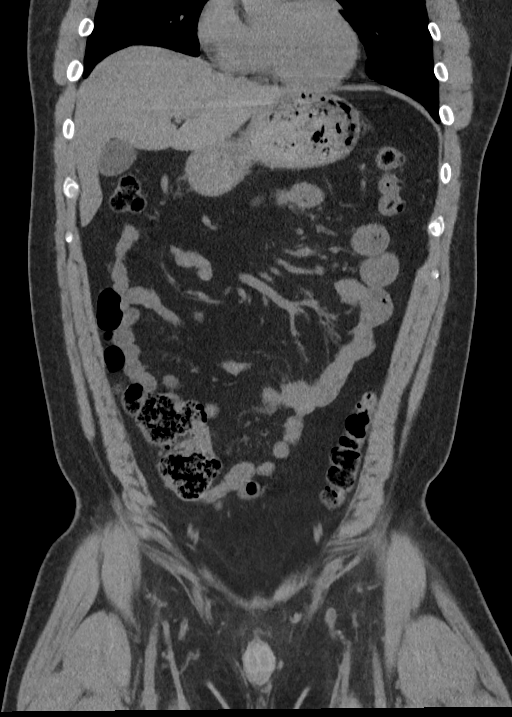
[im 61/137  soft-tissue]
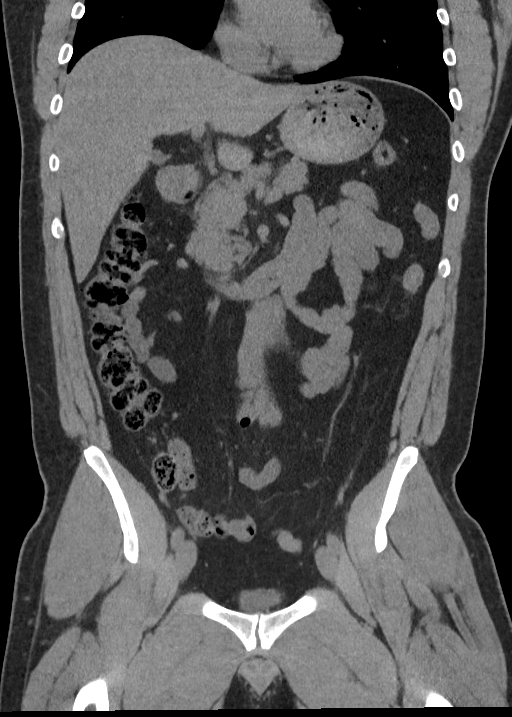
[im 76/137  soft-tissue]
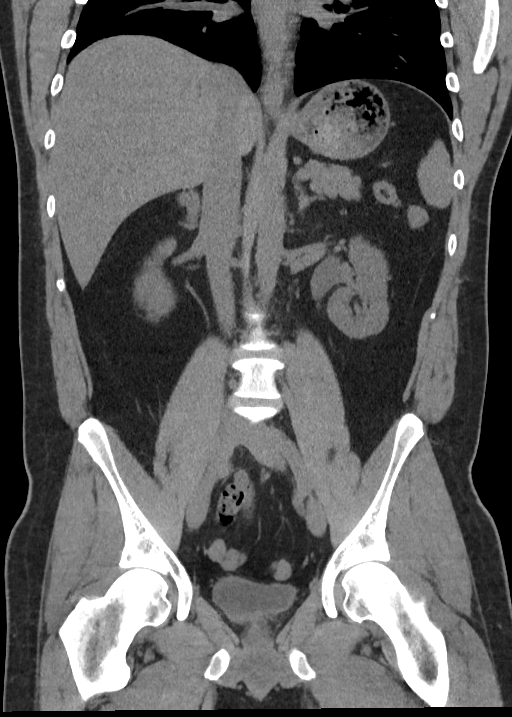

[16 of 46 positions shown; findings below may reference images not displayed]

FINDINGS: Lower chest: No acute abnormality.

Hepatobiliary: 1 cm hypodense lesion in the posterior right hepatic
lobe likely a cyst. No gallstones, gallbladder wall thickening, or
biliary dilatation.

Pancreas: Unremarkable. No pancreatic ductal dilatation or
surrounding inflammatory changes.

Spleen: Normal in size without focal abnormality.

Adrenals/Urinary Tract: Adrenal glands are normal. Hypodense lesion
mid pole left kidney, likely a cyst. Mild left hydronephrosis and
hydroureter, secondary to a 5 mm stone in the distal left ureter at
approximate S3 level. Bladder is otherwise unremarkable

Stomach/Bowel: Stomach is within normal limits. Appendix appears
normal. No evidence of bowel wall thickening, distention, or
inflammatory changes.

Vascular/Lymphatic: No significant vascular findings are present. No
enlarged abdominal or pelvic lymph nodes.

Reproductive: Prostate is unremarkable.

Other: No abdominal wall hernia or abnormality. No abdominopelvic
ascites.

Musculoskeletal: No acute or significant osseous findings.
IMPRESSION: 1. Mild left hydronephrosis and hydroureter, secondary to a 5 mm
distal left ureteral stone at the mid sacral level.
2. Hypodense lesions in the liver and left kidney, likely cysts

## 2021-06-08 ENCOUNTER — Ambulatory Visit: Payer: Self-pay | Admitting: Physician Assistant

## 2022-05-13 ENCOUNTER — Other Ambulatory Visit: Payer: Self-pay | Admitting: Family Medicine

## 2022-05-13 ENCOUNTER — Other Ambulatory Visit: Payer: Self-pay | Admitting: Otolaryngology

## 2022-05-13 ENCOUNTER — Ambulatory Visit
Admission: RE | Admit: 2022-05-13 | Discharge: 2022-05-13 | Disposition: A | Discharge status: Home / Self Care | Payer: BC Managed Care – PPO | Source: Ambulatory Visit | Attending: Family Medicine | Admitting: Family Medicine

## 2022-05-13 ENCOUNTER — Ambulatory Visit
Admission: RE | Admit: 2022-05-13 | Discharge: 2022-05-13 | Disposition: A | Discharge status: Home / Self Care | Payer: BC Managed Care – PPO | Attending: Family Medicine | Admitting: Family Medicine

## 2022-05-13 DIAGNOSIS — M5412 Radiculopathy, cervical region: Secondary | ICD-10-CM

## 2022-05-13 DIAGNOSIS — M25512 Pain in left shoulder: Secondary | ICD-10-CM

## 2023-07-23 ENCOUNTER — Other Ambulatory Visit: Payer: Self-pay | Admitting: Family Medicine

## 2023-07-23 DIAGNOSIS — R221 Localized swelling, mass and lump, neck: Secondary | ICD-10-CM

## 2023-07-29 ENCOUNTER — Ambulatory Visit
Admission: RE | Admit: 2023-07-29 | Discharge: 2023-07-29 | Disposition: A | Discharge status: Home / Self Care | Payer: BC Managed Care – PPO | Source: Ambulatory Visit | Attending: Family Medicine | Admitting: Family Medicine

## 2023-07-29 DIAGNOSIS — R221 Localized swelling, mass and lump, neck: Secondary | ICD-10-CM | POA: Insufficient documentation

## 2024-04-03 ENCOUNTER — Emergency Department
Admission: EM | Admit: 2024-04-03 | Discharge: 2024-04-03 | Disposition: A | Discharge status: Home / Self Care | Attending: Emergency Medicine | Admitting: Emergency Medicine

## 2024-04-03 ENCOUNTER — Other Ambulatory Visit: Payer: Self-pay

## 2024-04-03 ENCOUNTER — Emergency Department
Admission: EM | Admit: 2024-04-03 | Discharge: 2024-04-04 | Disposition: A | Discharge status: Home / Self Care | Attending: Emergency Medicine | Admitting: Emergency Medicine

## 2024-04-03 ENCOUNTER — Emergency Department

## 2024-04-03 DIAGNOSIS — N202 Calculus of kidney with calculus of ureter: Secondary | ICD-10-CM | POA: Insufficient documentation

## 2024-04-03 DIAGNOSIS — N189 Chronic kidney disease, unspecified: Secondary | ICD-10-CM | POA: Diagnosis not present

## 2024-04-03 DIAGNOSIS — R109 Unspecified abdominal pain: Secondary | ICD-10-CM | POA: Diagnosis present

## 2024-04-03 DIAGNOSIS — N23 Unspecified renal colic: Secondary | ICD-10-CM

## 2024-04-03 DIAGNOSIS — N200 Calculus of kidney: Secondary | ICD-10-CM | POA: Insufficient documentation

## 2024-04-03 DIAGNOSIS — N201 Calculus of ureter: Secondary | ICD-10-CM

## 2024-04-03 HISTORY — DX: Disorder of kidney and ureter, unspecified: N28.9

## 2024-04-03 LAB — BASIC METABOLIC PANEL WITH GFR
Anion gap: 10 (ref 5–15)
BUN: 14 mg/dL (ref 6–20)
CO2: 23 mmol/L (ref 22–32)
Calcium: 9.7 mg/dL (ref 8.9–10.3)
Chloride: 108 mmol/L (ref 98–111)
Creatinine, Ser: 1.11 mg/dL (ref 0.61–1.24)
GFR, Estimated: >60 mL/min (ref >60)
Glucose, Bld: 111 mg/dL — ABNORMAL HIGH (ref 70–99)
Potassium: 4 mmol/L (ref 3.5–5.1)
Sodium: 141 mmol/L (ref 135–145)

## 2024-04-03 LAB — URINALYSIS, ROUTINE W REFLEX MICROSCOPIC
Bacteria, UA: NONE SEEN
Bilirubin Urine: NEGATIVE
Glucose, UA: NEGATIVE mg/dL
Ketones, ur: NEGATIVE mg/dL
Leukocytes,Ua: NEGATIVE
Nitrite: NEGATIVE
Protein, ur: NEGATIVE mg/dL
Specific Gravity, Urine: 1.017 (ref 1.005–1.030)
Squamous Epithelial / HPF: 0 /HPF (ref 0–5)
pH: 6 (ref 5.0–8.0)

## 2024-04-03 LAB — CBC
HCT: 46.1 % (ref 39.0–52.0)
Hemoglobin: 15.6 g/dL (ref 13.0–17.0)
MCH: 30.8 pg (ref 26.0–34.0)
MCHC: 33.8 g/dL (ref 30.0–36.0)
MCV: 91.1 fL (ref 80.0–100.0)
Platelets: 210 10*3/uL (ref 150–400)
RBC: 5.06 MIL/uL (ref 4.22–5.81)
RDW: 13 % (ref 11.5–15.5)
WBC: 8 10*3/uL (ref 4.0–10.5)
nRBC: 0 % (ref 0.0–0.2)

## 2024-04-03 MED ORDER — SODIUM CHLORIDE 0.9 % IV BOLUS
1000.0000 mL | Freq: Once | INTRAVENOUS | Status: AC
  Administered 2024-04-03: 1000 mL via INTRAVENOUS

## 2024-04-03 MED ORDER — ONDANSETRON HCL 4 MG/2ML IJ SOLN
4.0000 mg | INTRAMUSCULAR | Status: AC
  Administered 2024-04-03: 4 mg via INTRAVENOUS
  Filled 2024-04-03: qty 2

## 2024-04-03 MED ORDER — TAMSULOSIN HCL 0.4 MG PO CAPS: 0.4000 mg | ORAL_CAPSULE | Freq: Every day | ORAL | 0 refills | Status: AC

## 2024-04-03 MED ORDER — MORPHINE SULFATE (PF) 4 MG/ML IV SOLN
4.0000 mg | Freq: Once | INTRAVENOUS | Status: AC
  Administered 2024-04-03: 4 mg via INTRAVENOUS
  Filled 2024-04-03: qty 1

## 2024-04-03 MED ORDER — KETOROLAC TROMETHAMINE 30 MG/ML IJ SOLN
15.0000 mg | Freq: Once | INTRAMUSCULAR | Status: AC
  Administered 2024-04-03: 15 mg via INTRAVENOUS
  Filled 2024-04-03: qty 1

## 2024-04-03 MED ORDER — KETOROLAC TROMETHAMINE 30 MG/ML IJ SOLN
30.0000 mg | Freq: Once | INTRAMUSCULAR | Status: AC
  Administered 2024-04-03: 30 mg via INTRAVENOUS
  Filled 2024-04-03: qty 1

## 2024-04-03 MED ORDER — OXYCODONE-ACETAMINOPHEN 7.5-325 MG PO TABS: 1.0000 | ORAL_TABLET | ORAL | 0 refills | Status: AC | PRN

## 2024-04-03 MED ORDER — LACTATED RINGERS IV BOLUS
1000.0000 mL | Freq: Once | INTRAVENOUS | Status: AC
  Administered 2024-04-03: 1000 mL via INTRAVENOUS

## 2024-04-03 MED ORDER — FENTANYL CITRATE PF 50 MCG/ML IJ SOSY
50.0000 ug | PREFILLED_SYRINGE | INTRAMUSCULAR | Status: DC | PRN
  Administered 2024-04-03: 50 ug via INTRAVENOUS
  Filled 2024-04-03 (×2): qty 1

## 2024-04-03 NOTE — Discharge Instructions (Signed)
 Call and schedule a follow up with your urologist or primary care provider if not improving over the next few days.  Return to the  ER for symptoms that change or worsen if unable to schedule an appointment.

## 2024-04-03 NOTE — ED Triage Notes (Signed)
 Hx of kidney stone and had to have it removed but pain that started this morning in left flank.  Difficulty urinating.  Patient appears uncomfortable and is diaphoretic.

## 2024-04-03 NOTE — ED Provider Notes (Signed)
 [facility] - Inpatient Provider Note    Event Date/Time   First MD Initiated Contact with Patient 04/03/24 [phone]     (approximate)   History   Flank Pain   HPI  Lillard Bailon. is a 43 y.o. male with history of gout, CKD, kidney stone and as listed in EMR presents to the emergency department for treatment and evaluation of right flank pain and difficulty urinating.  Pain is intense and similar to previous kidney stone.  No known fever.  Has had some nausea due to pain but no vomiting.Marland Kitchen      Physical Exam   Triage Vital Signs: ED Triage Vitals 04/03/24 0850  Encounter Vitals Group     BP 120/89     Systolic BP Percentile      Diastolic BP Percentile      Pulse Rate 72     Resp (!) 24     Temp 97.6 F (36.4 C)     Temp src      SpO2 100 %     Weight 240 lb (108.9 kg)     Height 6\' 1"  (1.854 m)     Head Circumference      Peak Flow      Pain Score 10     Pain Loc      Pain Education      Exclude from Growth Chart     Most recent vital signs: Vitals:   04/03/24 1130 04/03/24 1330  BP: 112/73 (!) 100/58  Pulse: 78 62  Resp: 20 20  Temp:    SpO2: 97% 96%    General: Awake, no distress.  CV:  Good peripheral perfusion.  Resp:  Normal effort.  Abd:  No distention.  Other:  CVA tenderness on left.   ED Results / Procedures / Treatments   Labs (all labs ordered are listed, but only abnormal results are displayed) Labs Reviewed  URINALYSIS, ROUTINE W REFLEX MICROSCOPIC - Abnormal; Notable for the following components:      Result Value   Color, Urine YELLOW (*)    APPearance CLEAR (*)    Hgb urine dipstick MODERATE (*)    All other components within normal limits  BASIC METABOLIC PANEL WITH GFR - Abnormal; Notable for the following components:   Glucose, Bld 111 (*)    All other components within normal limits  CBC     EKG  Not indicated   RADIOLOGY  Image and radiology report reviewed and interpreted by me. Radiology  report consistent with the same.  3 of 3 of a 3 mm distal right ureteral stone just proximal to the UVJ with some distention of the distal right ureter but no hydronephrosis.  2 mm nonobstructing interpolar left renal stone.  PROCEDURES:  Critical Care performed: No  Procedures   MEDICATIONS ORDERED IN ED:  Medications  fentaNYL (SUBLIMAZE) injection 50 mcg (50 mcg Intravenous Given 04/03/24 0900)  morphine (PF) 4 MG/ML injection 4 mg (4 mg Intravenous Given 04/03/24 1028)  sodium chloride 0.9 % bolus 1,000 mL (1,000 mLs Intravenous New Bag/Given 04/03/24 1153)  ketorolac (TORADOL) 30 MG/ML injection 30 mg (30 mg Intravenous Given 04/03/24 1152)     IMPRESSION / MDM / ASSESSMENT AND PLAN / ED COURSE   I have reviewed the triage note.  Differential diagnosis includes, but is not limited to, kidney stone, pyelonephritis, hydronephrosis, urethritis  Patient's presentation is most consistent with acute illness / injury with system symptoms.  43 year old male presenting  to the emergency department for treatment and evaluation of acute onset flank pain.  See HPI for the details.  Plan will be to get CT imaging, given IV fluids and medications for pain control and nausea.  CT shows a 3 x 3 x 3 mm stone at the UVJ without hydronephrosis.  Patient feels somewhat better but states that the pain is returning.  Additional medications ordered.  Results of the CT were reviewed with him and his wife.  IV fluids are infusing.  Medication submitted to the patient's pharmacy and he is to follow-up with his urologist or primary care provider if symptoms or not improving.  He is to return to the ER if symptoms change or worsen and he is unable to schedule appointment.      FINAL CLINICAL IMPRESSION(S) / ED DIAGNOSES   Final diagnoses:  Kidney stone     Rx / DC Orders   ED Discharge Orders          Ordered    tamsulosin (FLOMAX) 0.4 MG CAPS capsule  Daily        04/03/24 1330     oxyCODONE-acetaminophen (PERCOCET) 7.5-325 MG tablet  Every 4 hours PRN        04/03/24 1330             Note:  This document was prepared using Dragon voice recognition software and may include unintentional dictation errors.   Chinita Pester, FNP 04/03/24 1336    Minna Antis, MD 04/03/24 1446

## 2024-04-03 NOTE — ED Provider Notes (Signed)
 [facility] Provider Note    Event Date/Time   First MD Initiated Contact with Patient 04/03/24 2256     (approximate)   History   Flank Pain   HPI Pedro Elliott. is a 43 y.o. male who presents for persistent right flank pain.  He has a history of kidney stones including at least one that required ureteral stent.  He came to the ED earlier today and was evaluated for right flank pain and found to have a 3 x 3 x 3 mm stone just proximal to the right UVJ.  He had adequate pain control at the time and no other complicating factors and was discharged.  However, at home the pain got worse again and the oxycodone was not adequately controlling the pain so he came back.  He is currently quite uncomfortable and cannot find a position of comfort.  No recent fever.  He said that he is having urinary hesitancy where he feels like he cannot completely empty his bladder.  Positive for nausea.     Physical Exam   Triage Vital Signs: ED Triage Vitals [04/03/24 2242]  Encounter Vitals Group     BP (!) 144/95     Systolic BP Percentile      Diastolic BP Percentile      Pulse Rate 74     Resp (!) 22     Temp 97.9 F (36.6 C)     Temp Source Oral     SpO2 100 %     Weight      Height      Head Circumference      Peak Flow      Pain Score 10     Pain Loc      Pain Education      Exclude from Growth Chart     Most recent vital signs: Vitals:   04/04/24 0030 04/04/24 0130  BP: 129/71 134/84  Pulse: 84 89  Resp: 15 16  Temp:    SpO2: 93% 95%    General: Awake, uncomfortable appearing but nontoxic. CV:  Good peripheral perfusion.  Regular rate and rhythm. Resp:  Normal effort. Speaking easily and comfortably, no accessory muscle usage nor intercostal retractions.  Mild tachypnea due to pain. Abd:  No distention.  Tenderness to palpation of the right side of his abdomen and right flank.   ED Results / Procedures / Treatments   Labs (all labs  ordered are listed, but only abnormal results are displayed) Labs Reviewed  BASIC METABOLIC PANEL WITH GFR - Abnormal; Notable for the following components:      Result Value   Glucose, Bld 110 (*)    Creatinine, Ser 1.42 (*)    All other components within normal limits      RADIOLOGY I viewed and interpreted the patient's CT scan from earlier today confirmed the presence of a right-sided ureteral stone.   PROCEDURES:  Critical Care performed: No  .1-3 Lead EKG Interpretation  Performed by: Loleta Rose, MD Authorized by: Loleta Rose, MD     Interpretation: normal     ECG rate:  75   ECG rate assessment: normal     Rhythm: sinus rhythm     Ectopy: none     Conduction: normal       IMPRESSION / MDM / ASSESSMENT AND PLAN / ED COURSE  I reviewed the triage vital signs and the nursing notes.  Differential diagnosis includes, but is not limited to, persistent renal/ureteral colic, infected stone, UTI/pyelonephritis.  Patient's presentation is most consistent with acute presentation with potential threat to life or bodily function.  Labs/studies ordered: BMP  Interventions/Medications given:  Medications  morphine (PF) 4 MG/ML injection 4 mg (4 mg Intravenous Given 04/03/24 2353)  ketorolac (TORADOL) 30 MG/ML injection 15 mg (15 mg Intravenous Given 04/03/24 2354)  ondansetron (ZOFRAN) injection 4 mg (4 mg Intravenous Given 04/03/24 2354)  lactated ringers bolus 1,000 mL (0 mLs Intravenous Stopped 04/04/24 0127)  lidocaine (XYLOCAINE) 164 mg in sodium chloride 0.9 % 100 mL IVPB (0 mg Intravenous Stopped 04/04/24 0140)  oxyCODONE-acetaminophen (PERCOCET/ROXICET) 5-325 MG per tablet 2 tablet (2 tablets Oral Given 04/04/24 0253)    (Note:  hospital course my include additional interventions and/or labs/studies not listed above.)   I reviewed the patient's lab work from earlier today and verified no evidence of infection on urinalysis, just  hemoglobinuria.  Lab work was essentially normal at the time including his renal function was just a slight elevation of his creatinine.  I anticipate this is a matter of adequate pain control as he is passing the stone through the last bit of his urethra into the bladder.  I ordered morphine 4 mg IV, Toradol 15 mg IV, and Zofran 4 mg IV, as well as 1 L of LR IV bolus.  I will reassess to determine the need for additional medications; patient may benefit from a lidocaine infusion.  The patient is on the cardiac monitor to evaluate for evidence of arrhythmia and/or significant heart rate changes.   Clinical Course as of 04/04/24 0354  Wynelle Link Apr 04, 2024  0019 Patient feels better but is still having pain.  Still tender to palpation/percussion.  I am ordering lidocaine infusion per renal colic protocol. [CF]  0351 Reassessed patient.  He said he feels quite a bit better, still some discomfort but no pain.  His repeat metabolic panel shows a creatinine of 1.4, but his GFR is still greater than 60 and his BMI is normal.  I also gave him a liter of fluids.  He is comfortable with the plan for discharge.  I think that is very reasonable and I stressed to him the importance of staying hydrated, continue to take his pain medicine and Flomax that he was previously prescribed, etc.  He will call urology on Monday to schedule follow-up appointment for evaluation of possible lithotripsy this coming week.  I gave my usual and customary follow-up recommendations and return precautions. [CF]    Clinical Course User Index [CF] Loleta Rose, MD     FINAL CLINICAL IMPRESSION(S) / ED DIAGNOSES   Final diagnoses:  Ureteral colic  Right ureteral stone     Rx / DC Orders   ED Discharge Orders     None        Note:  This document was prepared using Dragon voice recognition software and may include unintentional dictation errors.   Loleta Rose, MD 04/04/24 [phone]

## 2024-04-03 NOTE — ED Triage Notes (Addendum)
 Pt was here earlier today for kidney stones, c/o of worsened right flank pain. Last oxycodone was one hour ago w/out relief. Pt is AOX4, appears uncomfortable. Pt states Toradol worked well for pain when given this morning.

## 2024-04-03 NOTE — ED Notes (Signed)
 Pt has IVF left, wants to get all the fluid before he goes.

## 2024-04-04 LAB — BASIC METABOLIC PANEL WITH GFR
Anion gap: 8 (ref 5–15)
BUN: 15 mg/dL (ref 6–20)
CO2: 23 mmol/L (ref 22–32)
Calcium: 8.9 mg/dL (ref 8.9–10.3)
Chloride: 107 mmol/L (ref 98–111)
Creatinine, Ser: 1.42 mg/dL — ABNORMAL HIGH (ref 0.61–1.24)
GFR, Estimated: >60 mL/min (ref >60)
Glucose, Bld: 110 mg/dL — ABNORMAL HIGH (ref 70–99)
Potassium: 3.8 mmol/L (ref 3.5–5.1)
Sodium: 138 mmol/L (ref 135–145)

## 2024-04-04 MED ORDER — OXYCODONE-ACETAMINOPHEN 5-325 MG PO TABS
2.0000 | ORAL_TABLET | Freq: Once | ORAL | Status: AC
  Administered 2024-04-04: 2 via ORAL
  Filled 2024-04-04: qty 2

## 2024-04-04 MED ORDER — SODIUM CHLORIDE 0.9 % IV SOLN
1.5000 mg/kg | Freq: Once | INTRAVENOUS | Status: AC
  Administered 2024-04-04: 164 mg via INTRAVENOUS
  Filled 2024-04-04: qty 8.2

## 2024-04-04 NOTE — Discharge Instructions (Signed)
 You have been seen in the Emergency Department (ED) today for pain caused by kidney stones.  As we have discussed, please drink plenty of fluids.  Please make a follow up appointment with the physician(s) listed elsewhere in this documentation.  You may take pain medication as needed but ONLY as prescribed.  Please also take your prescribed Flomax daily.  If you are going to follow up with Urology for possible lithotripsy, please do not take any ibuprofen, naproxen, aspirin, Toradol, or other NSAID after Tuesday morning, as this may exclude you from lithotripsy.  Please see your doctor as soon as possible as stones may take 1-3 weeks to pass and you may require additional care or medications.  Do not drink alcohol, drive or participate in any other potentially dangerous activities while taking opiate pain medication as it may make you sleepy. Do not take this medication with any other sedating medications, either prescription or over-the-counter. If you were prescribed Percocet or Vicodin, do not take these with acetaminophen (Tylenol) as it is already contained within these medications.   Take the Percocet you were previously prescribed as needed for severe pain.  This medication is an opiate (or narcotic) pain medication and can be habit forming.  Use it as little as possible to achieve adequate pain control.  Do not use or use it with extreme caution if you have a history of opiate abuse or dependence.  If you are on a pain contract with your primary care doctor or a pain specialist, be sure to let them know you were prescribed this medication today from the [facility] Emergency Department.  This medication is intended for your use only - do not give any to anyone else and keep it in a secure place where nobody else, especially children, have access to it.  It will also cause or worsen constipation, so you may want to consider taking an over-the-counter stool softener while you are taking this  medication.  Return to the Emergency Department (ED) or call your doctor if you have any worsening pain, fever, painful urination, are unable to urinate, or develop other symptoms that concern you.

## 2024-04-06 ENCOUNTER — Ambulatory Visit: Admitting: Urology

## 2024-04-06 ENCOUNTER — Encounter: Payer: Self-pay | Admitting: Urology
